# Patient Record
Sex: Female | Born: 1965 | Race: Black or African American | Hispanic: No | Smoking: Former smoker
Health system: Southern US, Community
[De-identification: ages and names within clinical notes are randomized; demographics above are authoritative.]

---

## 2021-05-13 ENCOUNTER — Inpatient Hospital Stay (HOSPITAL_COMMUNITY)
Admission: EM | Admit: 2021-05-13 | Discharge: 2021-05-18 | DRG: 871 | Disposition: A | Discharge status: Home / Self Care | Payer: Medicare HMO | Attending: Internal Medicine | Admitting: Internal Medicine

## 2021-05-13 ENCOUNTER — Emergency Department (HOSPITAL_COMMUNITY): Payer: Medicare HMO

## 2021-05-13 DIAGNOSIS — I248 Other forms of acute ischemic heart disease: Secondary | ICD-10-CM | POA: Diagnosis present

## 2021-05-13 DIAGNOSIS — J9601 Acute respiratory failure with hypoxia: Secondary | ICD-10-CM | POA: Diagnosis present

## 2021-05-13 DIAGNOSIS — E1122 Type 2 diabetes mellitus with diabetic chronic kidney disease: Secondary | ICD-10-CM | POA: Diagnosis present

## 2021-05-13 DIAGNOSIS — E1169 Type 2 diabetes mellitus with other specified complication: Secondary | ICD-10-CM | POA: Diagnosis present

## 2021-05-13 DIAGNOSIS — A419 Sepsis, unspecified organism: Principal | ICD-10-CM | POA: Diagnosis present

## 2021-05-13 DIAGNOSIS — R778 Other specified abnormalities of plasma proteins: Secondary | ICD-10-CM | POA: Diagnosis present

## 2021-05-13 DIAGNOSIS — Z20822 Contact with and (suspected) exposure to covid-19: Secondary | ICD-10-CM | POA: Diagnosis present

## 2021-05-13 DIAGNOSIS — Z86711 Personal history of pulmonary embolism: Secondary | ICD-10-CM

## 2021-05-13 DIAGNOSIS — Z794 Long term (current) use of insulin: Secondary | ICD-10-CM

## 2021-05-13 DIAGNOSIS — E7849 Other hyperlipidemia: Secondary | ICD-10-CM | POA: Diagnosis present

## 2021-05-13 DIAGNOSIS — Z7902 Long term (current) use of antithrombotics/antiplatelets: Secondary | ICD-10-CM

## 2021-05-13 DIAGNOSIS — I890 Lymphedema, not elsewhere classified: Secondary | ICD-10-CM

## 2021-05-13 DIAGNOSIS — N184 Chronic kidney disease, stage 4 (severe): Secondary | ICD-10-CM | POA: Diagnosis present

## 2021-05-13 DIAGNOSIS — Z950 Presence of cardiac pacemaker: Secondary | ICD-10-CM

## 2021-05-13 DIAGNOSIS — Z87891 Personal history of nicotine dependence: Secondary | ICD-10-CM

## 2021-05-13 DIAGNOSIS — I4891 Unspecified atrial fibrillation: Secondary | ICD-10-CM | POA: Diagnosis present

## 2021-05-13 DIAGNOSIS — Z79899 Other long term (current) drug therapy: Secondary | ICD-10-CM

## 2021-05-13 DIAGNOSIS — Z880 Allergy status to penicillin: Secondary | ICD-10-CM

## 2021-05-13 DIAGNOSIS — J440 Chronic obstructive pulmonary disease with acute lower respiratory infection: Secondary | ICD-10-CM | POA: Diagnosis present

## 2021-05-13 DIAGNOSIS — R6520 Severe sepsis without septic shock: Secondary | ICD-10-CM | POA: Diagnosis present

## 2021-05-13 DIAGNOSIS — I10 Essential (primary) hypertension: Secondary | ICD-10-CM | POA: Diagnosis present

## 2021-05-13 DIAGNOSIS — E871 Hypo-osmolality and hyponatremia: Secondary | ICD-10-CM | POA: Diagnosis present

## 2021-05-13 DIAGNOSIS — Z8673 Personal history of transient ischemic attack (TIA), and cerebral infarction without residual deficits: Secondary | ICD-10-CM

## 2021-05-13 DIAGNOSIS — I050 Rheumatic mitral stenosis: Secondary | ICD-10-CM | POA: Diagnosis present

## 2021-05-13 DIAGNOSIS — J441 Chronic obstructive pulmonary disease with (acute) exacerbation: Secondary | ICD-10-CM | POA: Diagnosis not present

## 2021-05-13 DIAGNOSIS — I130 Hypertensive heart and chronic kidney disease with heart failure and stage 1 through stage 4 chronic kidney disease, or unspecified chronic kidney disease: Secondary | ICD-10-CM | POA: Diagnosis present

## 2021-05-13 DIAGNOSIS — Z952 Presence of prosthetic heart valve: Secondary | ICD-10-CM

## 2021-05-13 DIAGNOSIS — J4540 Moderate persistent asthma, uncomplicated: Secondary | ICD-10-CM | POA: Diagnosis present

## 2021-05-13 DIAGNOSIS — E872 Acidosis, unspecified: Secondary | ICD-10-CM | POA: Diagnosis present

## 2021-05-13 DIAGNOSIS — G4733 Obstructive sleep apnea (adult) (pediatric): Secondary | ICD-10-CM | POA: Diagnosis present

## 2021-05-13 DIAGNOSIS — Z7901 Long term (current) use of anticoagulants: Secondary | ICD-10-CM

## 2021-05-13 DIAGNOSIS — I472 Ventricular tachycardia: Secondary | ICD-10-CM | POA: Diagnosis present

## 2021-05-13 DIAGNOSIS — Z888 Allergy status to other drugs, medicaments and biological substances status: Secondary | ICD-10-CM

## 2021-05-13 DIAGNOSIS — Z6841 Body Mass Index (BMI) 40.0 and over, adult: Secondary | ICD-10-CM

## 2021-05-13 DIAGNOSIS — I2510 Atherosclerotic heart disease of native coronary artery without angina pectoris: Secondary | ICD-10-CM | POA: Diagnosis present

## 2021-05-13 DIAGNOSIS — R2681 Unsteadiness on feet: Secondary | ICD-10-CM

## 2021-05-13 DIAGNOSIS — I5032 Chronic diastolic (congestive) heart failure: Secondary | ICD-10-CM | POA: Diagnosis present

## 2021-05-13 DIAGNOSIS — J189 Pneumonia, unspecified organism: Secondary | ICD-10-CM | POA: Diagnosis present

## 2021-05-13 DIAGNOSIS — E860 Dehydration: Secondary | ICD-10-CM | POA: Diagnosis present

## 2021-05-13 LAB — CBC WITH DIFFERENTIAL/PLATELET
Abs Immature Granulocytes: 0.09 10*3/uL — ABNORMAL HIGH (ref 0.00–0.07)
Basophils Absolute: 0.1 10*3/uL (ref 0.0–0.1)
Basophils Relative: 0 %
Eosinophils Absolute: 0.2 10*3/uL (ref 0.0–0.5)
Eosinophils Relative: 2 %
HCT: 41.4 % (ref 36.0–46.0)
Hemoglobin: 12.7 g/dL (ref 12.0–15.0)
Immature Granulocytes: 1 %
Lymphocytes Relative: 4 %
Lymphs Abs: 0.6 10*3/uL — ABNORMAL LOW (ref 0.7–4.0)
MCH: 25.8 pg — ABNORMAL LOW (ref 26.0–34.0)
MCHC: 30.7 g/dL (ref 30.0–36.0)
MCV: 84.1 fL (ref 80.0–100.0)
Monocytes Absolute: 0.6 10*3/uL (ref 0.1–1.0)
Monocytes Relative: 4 %
Neutro Abs: 14.2 10*3/uL — ABNORMAL HIGH (ref 1.7–7.7)
Neutrophils Relative %: 89 %
Platelets: 263 10*3/uL (ref 150–400)
RBC: 4.92 MIL/uL (ref 3.87–5.11)
RDW: 15.7 % — ABNORMAL HIGH (ref 11.5–15.5)
WBC: 15.8 10*3/uL — ABNORMAL HIGH (ref 4.0–10.5)
nRBC: 0 % (ref 0.0–0.2)

## 2021-05-13 LAB — COMPREHENSIVE METABOLIC PANEL
ALT: 19 U/L (ref 0–44)
AST: 18 U/L (ref 15–41)
Albumin: 3.2 g/dL — ABNORMAL LOW (ref 3.5–5.0)
Alkaline Phosphatase: 92 U/L (ref 38–126)
Anion gap: 13 (ref 5–15)
BUN: 62 mg/dL — ABNORMAL HIGH (ref 6–20)
CO2: 20 mmol/L — ABNORMAL LOW (ref 22–32)
Calcium: 9.1 mg/dL (ref 8.9–10.3)
Chloride: 101 mmol/L (ref 98–111)
Creatinine, Ser: 2.39 mg/dL — ABNORMAL HIGH (ref 0.44–1.00)
GFR, Estimated: 23 mL/min — ABNORMAL LOW (ref >60)
Glucose, Bld: 230 mg/dL — ABNORMAL HIGH (ref 70–99)
Potassium: 4.3 mmol/L (ref 3.5–5.1)
Sodium: 134 mmol/L — ABNORMAL LOW (ref 135–145)
Total Bilirubin: 0.9 mg/dL (ref 0.3–1.2)
Total Protein: 8.6 g/dL — ABNORMAL HIGH (ref 6.5–8.1)

## 2021-05-13 LAB — RESP PANEL BY RT-PCR (FLU A&B, COVID) ARPGX2
Influenza A by PCR: NEGATIVE
Influenza B by PCR: NEGATIVE
SARS Coronavirus 2 by RT PCR: NEGATIVE

## 2021-05-13 LAB — LIPASE, BLOOD: Lipase: 47 U/L (ref 11–51)

## 2021-05-13 LAB — LACTIC ACID, PLASMA: Lactic Acid, Venous: 2 mmol/L (ref 0.5–1.9)

## 2021-05-13 LAB — TROPONIN I (HIGH SENSITIVITY): Troponin I (High Sensitivity): 44 ng/L — ABNORMAL HIGH (ref <18)

## 2021-05-13 MED ORDER — METRONIDAZOLE 500 MG/100ML IV SOLN
500.0000 mg | Freq: Once | INTRAVENOUS | Status: AC
  Administered 2021-05-13: 500 mg via INTRAVENOUS
  Filled 2021-05-13: qty 100

## 2021-05-13 MED ORDER — LACTATED RINGERS IV SOLN: INTRAVENOUS | Status: DC

## 2021-05-13 MED ORDER — SODIUM CHLORIDE 0.9 % IV SOLN: 2.0000 g | INTRAVENOUS | Status: DC

## 2021-05-13 MED ORDER — VANCOMYCIN HCL IN DEXTROSE 1-5 GM/200ML-% IV SOLN
1000.0000 mg | Freq: Once | INTRAVENOUS | Status: AC
  Administered 2021-05-14: 1000 mg via INTRAVENOUS
  Filled 2021-05-13: qty 200

## 2021-05-13 MED ORDER — METHYLPREDNISOLONE SODIUM SUCC 125 MG IJ SOLR
125.0000 mg | Freq: Once | INTRAMUSCULAR | Status: AC
  Administered 2021-05-13: 125 mg via INTRAVENOUS
  Filled 2021-05-13: qty 2

## 2021-05-13 MED ORDER — ACETAMINOPHEN 325 MG PO TABS
650.0000 mg | ORAL_TABLET | Freq: Once | ORAL | Status: AC
  Administered 2021-05-14: 650 mg via ORAL
  Filled 2021-05-13: qty 2

## 2021-05-13 MED ORDER — SODIUM CHLORIDE 0.9 % IV SOLN
2.0000 g | Freq: Once | INTRAVENOUS | Status: AC
  Administered 2021-05-14: 2 g via INTRAVENOUS
  Filled 2021-05-13: qty 2

## 2021-05-13 MED ORDER — SODIUM CHLORIDE 0.9 % IV SOLN: Freq: Once | INTRAVENOUS | Status: AC

## 2021-05-13 MED ORDER — VANCOMYCIN VARIABLE DOSE PER UNSTABLE RENAL FUNCTION (PHARMACIST DOSING): Status: DC

## 2021-05-13 MED ORDER — MAGNESIUM SULFATE 2 GM/50ML IV SOLN
2.0000 g | Freq: Once | INTRAVENOUS | Status: AC
  Administered 2021-05-13: 2 g via INTRAVENOUS
  Filled 2021-05-13: qty 50

## 2021-05-13 NOTE — Sepsis Progress Note (Signed)
Monitoring for code sepsis protocol. 

## 2021-05-13 NOTE — ED Provider Notes (Signed)
[facility]ER[facility] DEPARTMENT Provider Note   CSN: [id number] Arrival date & time: 05/13/21  2020     History No chief complaint on file.   Carla Reynolds is a 55 y.o. female.  The history is provided by the patient, medical records and the EMS personnel. No language interpreter was used.  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  There are no problems to display for this patient.      OB History   No obstetric history on file.     No family history on file.     Home Medications Prior to Admission medications   Not on File    Allergies    Patient has no allergy information on record.  Review of Systems   Review of Systems  Constitutional:  Positive for chills, fatigue and fever.  HENT:  Positive for congestion.   Eyes:  Negative for visual disturbance.  Respiratory:  Positive for cough, sputum production, chest tightness, shortness of breath and wheezing.   Cardiovascular:  Positive for chest pain. Negative for palpitations and leg swelling.  Gastrointestinal:  Positive for diarrhea. Negative for abdominal pain, constipation, nausea and vomiting.  Genitourinary:  Negative for dysuria and flank pain.  Musculoskeletal:  Negative for back pain, neck pain and neck stiffness.  Skin:  Negative for rash and wound.  Neurological:  Negative for light-headedness and headaches.  Psychiatric/Behavioral:  Negative for agitation.   All other systems reviewed and are negative.  Physical  Exam Updated Vital Signs BP (!) 151/72 (BP Location: Right Wrist)   Pulse (!) 104   Temp 99.7 F (37.6 C) (Oral)   Resp (!) 24   SpO2 100%   Physical Exam Vitals and nursing note reviewed.  Constitutional:      General: She is not in acute distress.    Appearance: She is well-developed. She is ill-appearing. She is not toxic-appearing or diaphoretic.  HENT:     Head: Normocephalic and atraumatic.  Eyes:     Conjunctiva/sclera: Conjunctivae normal.     Pupils: Pupils are equal, round, and reactive to light.  Cardiovascular:     Rate and Rhythm: Regular rhythm. Tachycardia present.     Heart sounds: No murmur heard. Pulmonary:     Effort: Pulmonary effort is normal. Tachypnea present. No respiratory distress.     Breath sounds: Wheezing and rhonchi present. No rales.  Chest:     Chest wall: No tenderness.  Abdominal:     Palpations: Abdomen is soft.     Tenderness: There is no abdominal tenderness.  Musculoskeletal:     Cervical back: Neck supple.     Right lower leg: Edema present.     Left lower leg: Edema present.  Skin:    General: Skin is warm and dry.     Capillary Refill: Capillary refill takes less than 2 seconds.     Findings: No erythema.  Neurological:     General: No focal deficit present.     Mental Status:  She is alert.  Psychiatric:        Mood and Affect: Mood normal.    ED Results / Procedures / Treatments   Labs (all labs ordered are listed, but only abnormal results are displayed) Labs Reviewed  CBC WITH DIFFERENTIAL/PLATELET - Abnormal; Notable for the following components:      Result Value   WBC 15.8 (*)    MCH 25.8 (*)    RDW 15.7 (*)    Neutro Abs 14.2 (*)    Lymphs Abs 0.6 (*)    Abs Immature Granulocytes 0.09 (*)    All other components within normal limits  COMPREHENSIVE METABOLIC PANEL - Abnormal; Notable for the following components:   Sodium 134 (*)    CO2 20 (*)    Glucose, Bld 230 (*)    BUN 62 (*)    Creatinine, Ser 2.39  (*)    Total Protein 8.6 (*)    Albumin 3.2 (*)    GFR, Estimated 23 (*)    All other components within normal limits  LACTIC ACID, PLASMA - Abnormal; Notable for the following components:   Lactic Acid, Venous 2.0 (*)    All other components within normal limits  TROPONIN I (HIGH SENSITIVITY) - Abnormal; Notable for the following components:   Troponin I (High Sensitivity) 44 (*)    All other components within normal limits  RESP PANEL BY RT-PCR (FLU A&B, COVID) ARPGX2  CULTURE, BLOOD (ROUTINE X 2)  CULTURE, BLOOD (ROUTINE X 2)  LIPASE, BLOOD  LACTIC ACID, PLASMA  BRAIN NATRIURETIC PEPTIDE  TROPONIN I (HIGH SENSITIVITY)    EKG None  Radiology DG Chest Portable 1 View  Result Date: 05/13/2021 CLINICAL DATA:  Productive cough and chills, initial encounter EXAM: PORTABLE CHEST 1 VIEW COMPARISON:  None. FINDINGS: Cardiac shadow is within normal limits. Pacing device is seen in satisfactory position. Lungs are well aerated with mild right basilar atelectasis and small effusion. No other focal abnormality is seen. IMPRESSION: Mild right basilar atelectasis and effusion. Electronically Signed   By: Inez Catalina M.D.   On: 05/13/2021 21:51    Procedures Procedures   CRITICAL CARE Performed by: Gwenyth Allegra Mikah Poss Total critical care time: 40 minutes Critical care time was exclusive of separately billable procedures and treating other patients. Critical care was necessary to treat or prevent imminent or life-threatening deterioration. Critical care was time spent personally by me on the following activities: development of treatment plan with patient and/or surrogate as well as nursing, discussions with consultants, evaluation of patient's response to treatment, examination of patient, obtaining history from patient or surrogate, ordering and performing treatments and interventions, ordering and review of laboratory studies, ordering and review of radiographic studies, pulse oximetry  and re-evaluation of patient's condition.  Medications Ordered in ED Medications  ceFEPIme (MAXIPIME) 2 g in sodium chloride 0.9 % 100 mL IVPB (has no administration in time range)  vancomycin (VANCOCIN) IVPB 1000 mg/200 mL premix (has no administration in time range)  ceFEPIme (MAXIPIME) 2 g in sodium chloride 0.9 % 100 mL IVPB (has no administration in time range)  vancomycin variable dose per unstable renal function (pharmacist dosing) (has no administration in time range)  acetaminophen (TYLENOL) tablet 650 mg (has no administration in time range)  methylPREDNISolone sodium succinate (SOLU-MEDROL) 125 mg/2 mL injection 125 mg (125 mg Intravenous Given 05/13/21 2159)  magnesium sulfate IVPB 2 g 50 mL (0 g Intravenous Stopped 05/13/21 2331)  metroNIDAZOLE (FLAGYL) IVPB 500 mg (500 mg Intravenous New Bag/Given  05/13/21 2204)  0.9 %  sodium chloride infusion ( Intravenous New Bag/Given 05/13/21 2258)    ED Course  I have reviewed the triage vital signs and the nursing notes.  Pertinent labs & imaging results that were available during my care of the patient were reviewed by me and considered in my medical decision making (see chart for details).    MDM Rules/Calculators/A&P                           Carla Reynolds is a 55 y.o. 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  She also reports that for the last few days she has had a knot in her right thigh.  She says that she misses Eliquis frequently and is unsure if this feels like previous pulmonary embolism.  She says that she is fully vaccinated boosted against COVID-19 and has not had any sick exposures however starting yesterday started having this  constellation of URI symptoms.  She reports that today it worsened and when she went to get help she was found to have an oxygen saturation of 86% on room air.  She does not take oxygen at baseline.  She received several DuoNeb's with EMS with some improvement in her breathing but remains on a nonrebreather on arrival.  She is reporting no hemoptysis and denies any abdominal pain, nausea, vomiting.  She does report some diarrhea but denies constipation or urinary changes.  On exam, lungs have some coarseness as well as wheezing diffusely.  Chest was tender on the right chest.  Abdomen was nontender.  Good pulses in extremities.  Legs are edematous and her right thigh is tender to palpation.  She also has some tenderness in her right calf.  Patient is tachycardic and tachypneic and is on the nonrebreather.  She is warm to the touch and her second temperature returned at 103.1.  Clinically I am concerned with COVID-19 versus influenza infection given the ongoing pandemic however, pneumonia is also considered.  Given her new lump in her thigh that she feels with pain and her report of intermittent Eliquis use, DVT and pulmonary embolism is also strongly considered.  Given her ill appearance, we will likely make her a code sepsis but still check her for COVID and flu and make sure she does not have a pulmonary embolism.  Due to the hypoxia and her current illness, anticipate admission for further management.  Patient was made a code sepsis.  Patient's breathing began to slightly improve after magnesium, steroids, and the breathing treatments with EMS.  She was able to get off of oxygen but her temperature increased to 103.1.  She was given Tylenol.  X-ray shows some concern for pneumonia based on my evaluation of it however appears to show some atelectasis versus effusion on official read.  Clinically I am still concerned about a PE or DVT and the ultrasound was ordered.  CT scan is not able to be completed due  to her kidney function so she may need VQ scan during her admission however we will admit for presumptive pneumonia causing COPD exacerbation and hypoxia today.  Patient will be admitted for further management and received antibiotics for suspected pneumonia causing sepsis.  Final Clinical  Impression(s) / ED Diagnoses Final diagnoses:  Sepsis with acute renal failure without septic shock, due to unspecified organism, unspecified acute renal failure type ([place])  Community acquired pneumonia, unspecified laterality  COPD exacerbation ([place])    Clinical Impression: 1. Sepsis with acute renal failure without septic shock, due to unspecified organism, unspecified acute renal failure type ([place])   2. Community acquired pneumonia, unspecified laterality   3. COPD exacerbation ([place])     Disposition: Admit  This note was prepared with assistance of Dragon voice recognition software. Occasional wrong-word or sound-a-like substitutions may have occurred due to the inherent limitations of voice recognition software.     Watt Geiler, Gwenyth Allegra, MD 05/14/21 [phone]

## 2021-05-13 NOTE — Progress Notes (Signed)
Pharmacy Antibiotic Note  Carla Reynolds is a 55 y.o. female admitted on 05/13/2021 with sepsis.  Pharmacy has been consulted for Cefepime and vancomycin dosing.  WBC and SCr elevated. LA 2. nCrCl ~ 30 ml/min   Plan: -Start cefepime 2 gm IV 24 hours -Vancomycin 1 gm Ioad followed by vancomycin dosing per levels  -Monitor CBC, renal fx, cultures and clinical progress     Temp (24hrs), Avg:101.4 F (38.6 C), Min:99.7 F (37.6 C), Max:103.1 F (39.5 C)  No results for input(s): WBC, CREATININE, LATICACIDVEN, VANCOTROUGH, VANCOPEAK, VANCORANDOM, GENTTROUGH, GENTPEAK, GENTRANDOM, TOBRATROUGH, TOBRAPEAK, TOBRARND, AMIKACINPEAK, AMIKACINTROU, AMIKACIN in the last 168 hours.  CrCl cannot be calculated (No successful lab value found.).    Not on File  Antimicrobials this admission: Cefepime 8/16 >>  Vancomycin 8/16 >>   Dose adjustments this admission:   Microbiology results: 8/16 BCx:   Thank you for allowing pharmacy to be a part of this patient's care.  Albertina Parr, PharmD., BCPS, BCCCP Clinical Pharmacist Please refer to [facility] for unit-specific pharmacist

## 2021-05-13 NOTE — ED Triage Notes (Addendum)
Pt BIB [facility] from home for SOB, N/V/HA/Chills beginning around 1800 yest  Evening. Also complaining of a knot in her right thigh.  She takes eliquis but has missing some doses intermittently.  EMS reports pt was 86% on RA with fire.  EMS reports she has been 98% on NRB and/or aerosol mask for the 2 Duoneb's she received.   Pt has hx of COPD.  SHe does not wear 02 all the time but wears CPAP at night.   Last set of VS for EMS was 160/70, ventricular paced rhythm of 110 and CBG of 221. Pt did not take insulin today b/c of not feeling well.

## 2021-05-13 NOTE — ED Notes (Signed)
Pt is a hard stick I was unable to find a suitable vain for blood draw. Nurse was notified and states IV team has been called for this pt.

## 2021-05-14 ENCOUNTER — Observation Stay (HOSPITAL_COMMUNITY): Payer: Medicare HMO

## 2021-05-14 ENCOUNTER — Encounter (HOSPITAL_COMMUNITY): Payer: Self-pay | Admitting: Internal Medicine

## 2021-05-14 DIAGNOSIS — Z20822 Contact with and (suspected) exposure to covid-19: Secondary | ICD-10-CM | POA: Diagnosis present

## 2021-05-14 DIAGNOSIS — E860 Dehydration: Secondary | ICD-10-CM | POA: Diagnosis present

## 2021-05-14 DIAGNOSIS — J440 Chronic obstructive pulmonary disease with acute lower respiratory infection: Secondary | ICD-10-CM | POA: Diagnosis present

## 2021-05-14 DIAGNOSIS — R778 Other specified abnormalities of plasma proteins: Secondary | ICD-10-CM

## 2021-05-14 DIAGNOSIS — E1169 Type 2 diabetes mellitus with other specified complication: Secondary | ICD-10-CM | POA: Diagnosis present

## 2021-05-14 DIAGNOSIS — Z8679 Personal history of other diseases of the circulatory system: Secondary | ICD-10-CM

## 2021-05-14 DIAGNOSIS — I10 Essential (primary) hypertension: Secondary | ICD-10-CM

## 2021-05-14 DIAGNOSIS — J441 Chronic obstructive pulmonary disease with (acute) exacerbation: Secondary | ICD-10-CM | POA: Diagnosis present

## 2021-05-14 DIAGNOSIS — E7849 Other hyperlipidemia: Secondary | ICD-10-CM | POA: Diagnosis present

## 2021-05-14 DIAGNOSIS — G4733 Obstructive sleep apnea (adult) (pediatric): Secondary | ICD-10-CM | POA: Diagnosis present

## 2021-05-14 DIAGNOSIS — J189 Pneumonia, unspecified organism: Secondary | ICD-10-CM

## 2021-05-14 DIAGNOSIS — E872 Acidosis, unspecified: Secondary | ICD-10-CM | POA: Diagnosis present

## 2021-05-14 DIAGNOSIS — E1122 Type 2 diabetes mellitus with diabetic chronic kidney disease: Secondary | ICD-10-CM | POA: Diagnosis present

## 2021-05-14 DIAGNOSIS — I2510 Atherosclerotic heart disease of native coronary artery without angina pectoris: Secondary | ICD-10-CM | POA: Diagnosis present

## 2021-05-14 DIAGNOSIS — I050 Rheumatic mitral stenosis: Secondary | ICD-10-CM | POA: Diagnosis present

## 2021-05-14 DIAGNOSIS — E871 Hypo-osmolality and hyponatremia: Secondary | ICD-10-CM | POA: Diagnosis present

## 2021-05-14 DIAGNOSIS — Z952 Presence of prosthetic heart valve: Secondary | ICD-10-CM

## 2021-05-14 DIAGNOSIS — R6520 Severe sepsis without septic shock: Secondary | ICD-10-CM | POA: Diagnosis present

## 2021-05-14 DIAGNOSIS — I890 Lymphedema, not elsewhere classified: Secondary | ICD-10-CM

## 2021-05-14 DIAGNOSIS — Z794 Long term (current) use of insulin: Secondary | ICD-10-CM | POA: Diagnosis not present

## 2021-05-14 DIAGNOSIS — I472 Ventricular tachycardia: Secondary | ICD-10-CM | POA: Diagnosis present

## 2021-05-14 DIAGNOSIS — I130 Hypertensive heart and chronic kidney disease with heart failure and stage 1 through stage 4 chronic kidney disease, or unspecified chronic kidney disease: Secondary | ICD-10-CM | POA: Diagnosis present

## 2021-05-14 DIAGNOSIS — Z7901 Long term (current) use of anticoagulants: Secondary | ICD-10-CM

## 2021-05-14 DIAGNOSIS — N184 Chronic kidney disease, stage 4 (severe): Secondary | ICD-10-CM | POA: Diagnosis present

## 2021-05-14 DIAGNOSIS — Z6841 Body Mass Index (BMI) 40.0 and over, adult: Secondary | ICD-10-CM | POA: Diagnosis not present

## 2021-05-14 DIAGNOSIS — M79604 Pain in right leg: Secondary | ICD-10-CM | POA: Diagnosis not present

## 2021-05-14 DIAGNOSIS — J4540 Moderate persistent asthma, uncomplicated: Secondary | ICD-10-CM | POA: Diagnosis present

## 2021-05-14 DIAGNOSIS — A419 Sepsis, unspecified organism: Secondary | ICD-10-CM | POA: Diagnosis present

## 2021-05-14 DIAGNOSIS — I5032 Chronic diastolic (congestive) heart failure: Secondary | ICD-10-CM | POA: Diagnosis present

## 2021-05-14 DIAGNOSIS — I4819 Other persistent atrial fibrillation: Secondary | ICD-10-CM

## 2021-05-14 DIAGNOSIS — E782 Mixed hyperlipidemia: Secondary | ICD-10-CM

## 2021-05-14 DIAGNOSIS — J9601 Acute respiratory failure with hypoxia: Secondary | ICD-10-CM | POA: Diagnosis present

## 2021-05-14 DIAGNOSIS — I248 Other forms of acute ischemic heart disease: Secondary | ICD-10-CM | POA: Diagnosis present

## 2021-05-14 HISTORY — DX: Atherosclerotic heart disease of native coronary artery without angina pectoris: I25.10

## 2021-05-14 HISTORY — DX: Personal history of other diseases of the circulatory system: Z86.79

## 2021-05-14 HISTORY — DX: Type 2 diabetes mellitus with other specified complication: E11.69

## 2021-05-14 HISTORY — DX: Chronic kidney disease, stage 4 (severe): N18.4

## 2021-05-14 HISTORY — DX: Type 2 diabetes mellitus with diabetic chronic kidney disease: E11.22

## 2021-05-14 HISTORY — DX: Lymphedema, not elsewhere classified: I89.0

## 2021-05-14 HISTORY — DX: Essential (primary) hypertension: I10

## 2021-05-14 HISTORY — DX: Long term (current) use of insulin: Z79.4

## 2021-05-14 HISTORY — DX: Moderate persistent asthma, uncomplicated: J45.40

## 2021-05-14 HISTORY — DX: Presence of prosthetic heart valve: Z95.2

## 2021-05-14 LAB — GLUCOSE, CAPILLARY
Glucose-Capillary: 252 mg/dL — ABNORMAL HIGH (ref 70–99)
Glucose-Capillary: 296 mg/dL — ABNORMAL HIGH (ref 70–99)

## 2021-05-14 LAB — COMPREHENSIVE METABOLIC PANEL
ALT: 17 U/L (ref 0–44)
AST: 22 U/L (ref 15–41)
Albumin: 2.7 g/dL — ABNORMAL LOW (ref 3.5–5.0)
Alkaline Phosphatase: 62 U/L (ref 38–126)
Anion gap: 15 (ref 5–15)
BUN: 61 mg/dL — ABNORMAL HIGH (ref 6–20)
CO2: 16 mmol/L — ABNORMAL LOW (ref 22–32)
Calcium: 8.6 mg/dL — ABNORMAL LOW (ref 8.9–10.3)
Chloride: 102 mmol/L (ref 98–111)
Creatinine, Ser: 2.38 mg/dL — ABNORMAL HIGH (ref 0.44–1.00)
GFR, Estimated: 24 mL/min — ABNORMAL LOW (ref >60)
Glucose, Bld: 275 mg/dL — ABNORMAL HIGH (ref 70–99)
Potassium: 4.9 mmol/L (ref 3.5–5.1)
Sodium: 133 mmol/L — ABNORMAL LOW (ref 135–145)
Total Bilirubin: 1.5 mg/dL — ABNORMAL HIGH (ref 0.3–1.2)
Total Protein: 7.5 g/dL (ref 6.5–8.1)

## 2021-05-14 LAB — CBC WITH DIFFERENTIAL/PLATELET
Abs Immature Granulocytes: 0.07 10*3/uL (ref 0.00–0.07)
Basophils Absolute: 0.1 10*3/uL (ref 0.0–0.1)
Basophils Relative: 0 %
Eosinophils Absolute: 0.1 10*3/uL (ref 0.0–0.5)
Eosinophils Relative: 0 %
HCT: 38.2 % (ref 36.0–46.0)
Hemoglobin: 11.7 g/dL — ABNORMAL LOW (ref 12.0–15.0)
Immature Granulocytes: 0 %
Lymphocytes Relative: 2 %
Lymphs Abs: 0.3 10*3/uL — ABNORMAL LOW (ref 0.7–4.0)
MCH: 25.9 pg — ABNORMAL LOW (ref 26.0–34.0)
MCHC: 30.6 g/dL (ref 30.0–36.0)
MCV: 84.5 fL (ref 80.0–100.0)
Monocytes Absolute: 0.3 10*3/uL (ref 0.1–1.0)
Monocytes Relative: 2 %
Neutro Abs: 16.9 10*3/uL — ABNORMAL HIGH (ref 1.7–7.7)
Neutrophils Relative %: 96 %
Platelets: 195 10*3/uL (ref 150–400)
RBC: 4.52 MIL/uL (ref 3.87–5.11)
RDW: 15.8 % — ABNORMAL HIGH (ref 11.5–15.5)
WBC: 17.8 10*3/uL — ABNORMAL HIGH (ref 4.0–10.5)
nRBC: 0 % (ref 0.0–0.2)

## 2021-05-14 LAB — TROPONIN I (HIGH SENSITIVITY)
Troponin I (High Sensitivity): 148 ng/L (ref <18)
Troponin I (High Sensitivity): 218 ng/L (ref <18)
Troponin I (High Sensitivity): 184 ng/L (ref <18)
Troponin I (High Sensitivity): 173 ng/L (ref <18)

## 2021-05-14 LAB — CBG MONITORING, ED
Glucose-Capillary: 273 mg/dL — ABNORMAL HIGH (ref 70–99)
Glucose-Capillary: 232 mg/dL — ABNORMAL HIGH (ref 70–99)

## 2021-05-14 LAB — LACTIC ACID, PLASMA: Lactic Acid, Venous: 1.7 mmol/L (ref 0.5–1.9)

## 2021-05-14 LAB — HEMOGLOBIN A1C
Hgb A1c MFr Bld: 8.5 % — ABNORMAL HIGH (ref 4.8–5.6)
Mean Plasma Glucose: 197.25 mg/dL

## 2021-05-14 LAB — BRAIN NATRIURETIC PEPTIDE: B Natriuretic Peptide: 131.3 pg/mL — ABNORMAL HIGH (ref 0.0–100.0)

## 2021-05-14 LAB — HIV ANTIBODY (ROUTINE TESTING W REFLEX): HIV Screen 4th Generation wRfx: NONREACTIVE

## 2021-05-14 LAB — MAGNESIUM: Magnesium: 2 mg/dL (ref 1.7–2.4)

## 2021-05-14 MED ORDER — CEFTRIAXONE SODIUM 2 G IJ SOLR
2.0000 g | INTRAMUSCULAR | Status: AC
Start: 2021-05-14 — End: 2021-05-18
  Administered 2021-05-14 – 2021-05-18 (×5): 2 g via INTRAVENOUS
  Filled 2021-05-14 (×5): qty 20

## 2021-05-14 MED ORDER — LACTATED RINGERS IV SOLN: INTRAVENOUS | Status: DC

## 2021-05-14 MED ORDER — LOSARTAN POTASSIUM 25 MG PO TABS
25.0000 mg | ORAL_TABLET | Freq: Every day | ORAL | Status: DC
  Administered 2021-05-14 – 2021-05-16 (×3): 25 mg via ORAL
  Filled 2021-05-14 (×3): qty 1

## 2021-05-14 MED ORDER — FLUTICASONE PROPIONATE 50 MCG/ACT NA SUSP: 1.0000 | Freq: Every day | NASAL | Status: DC | PRN

## 2021-05-14 MED ORDER — ALBUTEROL SULFATE (2.5 MG/3ML) 0.083% IN NEBU
2.5000 mg | INHALATION_SOLUTION | RESPIRATORY_TRACT | Status: DC | PRN
  Administered 2021-05-15 – 2021-05-18 (×3): 2.5 mg via RESPIRATORY_TRACT
  Filled 2021-05-14 (×3): qty 3

## 2021-05-14 MED ORDER — INSULIN ASPART 100 UNIT/ML IJ SOLN
0.0000 [IU] | Freq: Three times a day (TID) | INTRAMUSCULAR | Status: DC
  Administered 2021-05-14: 7 [IU] via SUBCUTANEOUS
  Administered 2021-05-14 (×2): 11 [IU] via SUBCUTANEOUS
  Administered 2021-05-15 (×3): 4 [IU] via SUBCUTANEOUS
  Administered 2021-05-16: 3 [IU] via SUBCUTANEOUS
  Administered 2021-05-16: 4 [IU] via SUBCUTANEOUS
  Administered 2021-05-16: 7 [IU] via SUBCUTANEOUS
  Administered 2021-05-16 – 2021-05-17 (×2): 4 [IU] via SUBCUTANEOUS
  Administered 2021-05-17: 3 [IU] via SUBCUTANEOUS
  Administered 2021-05-17: 7 [IU] via SUBCUTANEOUS
  Administered 2021-05-17: 3 [IU] via SUBCUTANEOUS
  Administered 2021-05-18: 13 [IU] via SUBCUTANEOUS
  Administered 2021-05-18: 3 [IU] via SUBCUTANEOUS

## 2021-05-14 MED ORDER — LACTATED RINGERS IV BOLUS
1000.0000 mL | Freq: Once | INTRAVENOUS | Status: AC
  Administered 2021-05-14: 1000 mL via INTRAVENOUS

## 2021-05-14 MED ORDER — ONDANSETRON HCL 4 MG/2ML IJ SOLN: 4.0000 mg | Freq: Four times a day (QID) | INTRAMUSCULAR | Status: DC | PRN

## 2021-05-14 MED ORDER — GABAPENTIN 100 MG PO CAPS
100.0000 mg | ORAL_CAPSULE | Freq: Three times a day (TID) | ORAL | Status: DC
  Administered 2021-05-14 – 2021-05-18 (×13): 100 mg via ORAL
  Filled 2021-05-14 (×14): qty 1

## 2021-05-14 MED ORDER — ALBUTEROL SULFATE HFA 108 (90 BASE) MCG/ACT IN AERS: 2.0000 | INHALATION_SPRAY | Freq: Four times a day (QID) | RESPIRATORY_TRACT | Status: DC | PRN

## 2021-05-14 MED ORDER — APIXABAN 5 MG PO TABS
5.0000 mg | ORAL_TABLET | Freq: Two times a day (BID) | ORAL | Status: DC
  Administered 2021-05-14 – 2021-05-18 (×9): 5 mg via ORAL
  Filled 2021-05-14 (×9): qty 1

## 2021-05-14 MED ORDER — ACETAMINOPHEN 325 MG PO TABS
650.0000 mg | ORAL_TABLET | Freq: Four times a day (QID) | ORAL | Status: DC | PRN
  Administered 2021-05-14 – 2021-05-18 (×5): 650 mg via ORAL
  Filled 2021-05-14 (×5): qty 2

## 2021-05-14 MED ORDER — LOPERAMIDE HCL 2 MG PO CAPS
2.0000 mg | ORAL_CAPSULE | ORAL | Status: DC | PRN
  Administered 2021-05-14: 2 mg via ORAL
  Filled 2021-05-14: qty 1

## 2021-05-14 MED ORDER — ATORVASTATIN CALCIUM 80 MG PO TABS
80.0000 mg | ORAL_TABLET | Freq: Every day | ORAL | Status: DC
  Administered 2021-05-14 – 2021-05-18 (×5): 80 mg via ORAL
  Filled 2021-05-14 (×5): qty 1

## 2021-05-14 MED ORDER — ONDANSETRON HCL 4 MG PO TABS: 4.0000 mg | ORAL_TABLET | Freq: Four times a day (QID) | ORAL | Status: DC | PRN

## 2021-05-14 MED ORDER — LORATADINE 10 MG PO TABS
10.0000 mg | ORAL_TABLET | Freq: Every day | ORAL | Status: DC
  Administered 2021-05-14 – 2021-05-18 (×5): 10 mg via ORAL
  Filled 2021-05-14 (×5): qty 1

## 2021-05-14 MED ORDER — ACETAMINOPHEN 650 MG RE SUPP: 650.0000 mg | Freq: Four times a day (QID) | RECTAL | Status: DC | PRN

## 2021-05-14 MED ORDER — POLYETHYLENE GLYCOL 3350 17 G PO PACK
17.0000 g | PACK | Freq: Every day | ORAL | Status: DC | PRN
Start: 2021-05-14 — End: 2021-05-19

## 2021-05-14 MED ORDER — INSULIN ASPART 100 UNIT/ML IJ SOLN
10.0000 [IU] | Freq: Three times a day (TID) | INTRAMUSCULAR | Status: DC
  Administered 2021-05-14 – 2021-05-18 (×12): 10 [IU] via SUBCUTANEOUS

## 2021-05-14 MED ORDER — AMLODIPINE BESYLATE 5 MG PO TABS
5.0000 mg | ORAL_TABLET | Freq: Every day | ORAL | Status: DC
  Administered 2021-05-14 – 2021-05-18 (×5): 5 mg via ORAL
  Filled 2021-05-14 (×5): qty 1

## 2021-05-14 MED ORDER — SODIUM CHLORIDE 0.9 % IV SOLN
100.0000 mg | Freq: Two times a day (BID) | INTRAVENOUS | Status: DC
  Administered 2021-05-14 – 2021-05-18 (×8): 100 mg via INTRAVENOUS
  Filled 2021-05-14 (×10): qty 100

## 2021-05-14 MED ORDER — SODIUM CHLORIDE 0.9 % IV SOLN
500.0000 mg | Freq: Every day | INTRAVENOUS | Status: DC
  Administered 2021-05-14: 500 mg via INTRAVENOUS
  Filled 2021-05-14 (×2): qty 500

## 2021-05-14 MED ORDER — PREDNISOLONE ACETATE 1 % OP SUSP
1.0000 [drp] | Freq: Three times a day (TID) | OPHTHALMIC | Status: DC
  Administered 2021-05-14 – 2021-05-18 (×17): 1 [drp] via OPHTHALMIC
  Filled 2021-05-14: qty 5

## 2021-05-14 MED ORDER — FLUTICASONE FUROATE-VILANTEROL 200-25 MCG/INH IN AEPB
1.0000 | INHALATION_SPRAY | Freq: Every day | RESPIRATORY_TRACT | Status: DC
  Administered 2021-05-16 – 2021-05-18 (×3): 1 via RESPIRATORY_TRACT
  Filled 2021-05-14 (×2): qty 28

## 2021-05-14 MED ORDER — CARVEDILOL 12.5 MG PO TABS
12.5000 mg | ORAL_TABLET | Freq: Two times a day (BID) | ORAL | Status: DC
  Administered 2021-05-14 – 2021-05-18 (×10): 12.5 mg via ORAL
  Filled 2021-05-14 (×3): qty 1
  Filled 2021-05-14: qty 4
  Filled 2021-05-14 (×6): qty 1

## 2021-05-14 MED ORDER — INSULIN DETEMIR 100 UNIT/ML ~~LOC~~ SOLN
22.0000 [IU] | Freq: Two times a day (BID) | SUBCUTANEOUS | Status: DC
  Administered 2021-05-14 – 2021-05-18 (×9): 22 [IU] via SUBCUTANEOUS
  Filled 2021-05-14 (×10): qty 0.22

## 2021-05-14 NOTE — Progress Notes (Signed)
   05/14/21 1955  Assess: MEWS Score  Temp (!) 101.7 F (38.7 C)  BP 119/71  Pulse Rate 78  Resp 14  SpO2 91 %  O2 Device Room Air  Assess: MEWS Score  MEWS Temp 2  MEWS Systolic 0  MEWS Pulse 0  MEWS RR 0  MEWS LOC 0  MEWS Score 2  MEWS Score Color Yellow  Assess: if the MEWS score is Yellow or Red  Were vital signs taken at a resting state? Yes  Focused Assessment No change from prior assessment  Early Detection of Sepsis Score *See Row Information* High  MEWS guidelines implemented *See Row Information* Yes  Treat  MEWS Interventions Administered prn meds/treatments  Pain Scale 0-10  Pain Score 0  Take Vital Signs  Increase Vital Sign Frequency  Yellow: Q 2hr X 2 then Q 4hr X 2, if remains yellow, continue Q 4hrs  Escalate  MEWS: Escalate Yellow: discuss with charge nurse/RN and consider discussing with provider and RRT  Notify: Charge Nurse/RN  Name of Charge Nurse/RN Notified Lyn, RN  Date Charge Nurse/RN Notified 05/14/21  Time Charge Nurse/RN Notified 2000  Notify: Provider  Provider Name/Title Sidney Ace, MD  Date Provider Notified 05/14/21  Time Provider Notified 2000  Notification Type Page  Notification Reason Change in status  Provider response Other (Comment) (give tylneol early)  Date of Provider Response 05/14/21  Document  Patient Outcome Stabilized after interventions  Progress note created (see row info) Yes   Pt with elevated temperature of 101.36F. PRN Tylenol not due at this time.  MD notified and stated to give the prn tylenol early at this time.  Repeat temp 99.36F.  Yellow MEWS protocol initiated.

## 2021-05-14 NOTE — ED Notes (Signed)
Pt called out and said she had a BM. Pt had diarrhea. Peri-care completed. Total bed changed. Purewick removed and trashed. Gown changed. Pt denies further needs.

## 2021-05-14 NOTE — ED Notes (Signed)
I introduced myself to pt. Pt very sleepy. Oriented x4. Denies pain. Pt hooked up to purewick, but with minimal output in the bucket.

## 2021-05-14 NOTE — ED Notes (Signed)
Pt placed on bedpan as requested and had a bowel movement and urinated which spilled on the bed. PT was then cleaned and sheets changed with the help of another RN

## 2021-05-14 NOTE — ED Notes (Signed)
Blood draw unsuccessful 

## 2021-05-14 NOTE — ED Notes (Signed)
MD Shalhoub notified of critical trop result

## 2021-05-14 NOTE — Plan of Care (Signed)
Admitted from ED via stretcher.  Alert x oriented x 4. Denies any pain at the moment. No SOB noted. Will continue to monitor.

## 2021-05-14 NOTE — Progress Notes (Addendum)
Patient with 11 beats of VT.  Verbal order for STAT mag and potassium lab.  Orders placed.   Potassium 4.3 and magnesium 2.2. No further orders at this time.

## 2021-05-14 NOTE — H&P (Signed)
History and Physical    Carla Reynolds [id number] DOB: 09/07/1966 DOA: 05/13/2021  PCP: Mosu, Darlin Coco, NP  Patient coming from: Home via EMS   Chief Complaint:  Chief Complaint  Patient presents with   Shortness of Breath     HPI:    55 year old female with past medical history of chronic kidney disease stage IV, pulmonary embolism, obstructive sleep apnea, hypertension, hyperlipidemia, insulin-dependent diabetes mellitus type 2, moderate persistent asthma, coronary artery disease (Cath in 2020 with moderate multivessel disease), severe aortic stenosis (S/P TAVR 11/2018), pacemaker placement (0000000), diastolic congestive heart failure (Echo 01/2021 with G2DD) who presents to [facility] emergency department via EMS with complaints of shortness of breath.  Patient explains that for approximate the past 2 days she has been experiencing progressively worsening shortness of breath.  Shortness of breath is severe in intensity, worse with exertion and improved with rest.  Shortness of breath associated with cough, intermittently productive with bouts of fever and chills.  Patient complains of associated generalized weakness, nausea and occasional vomiting over the same span of time.  As a result patient has had an extremely poor appetite.  Upon further questioning patient denies sick contacts, recent travel or contact with confirmed COVID-19 infection.  Due to patient's progressively worsening symptoms she eventually contacted EMS who promptly came to evaluate the patient.  Per EMS, patient was found to be saturating in the 80s upon initial assessment was placed on submental oxygen.  Patient was then brought into [facility] emergency department for evaluation.  Upon evaluation in the emergency department patient was found to exhibit multiple SIRS criteria including leukocytosis of 15.8, temperature of 103.1 F and a mild lactic acidosis of 2.0.  Patient was additionally  found to have slightly elevated troponin of 44.  Patient was initiated on intravenous antibiotic therapy initially with intravenous cefepime and vancomycin.  The hospitalist group was then called to assess the patient for admission to the hospital  Review of Systems:   Review of Systems  Constitutional:  Positive for chills, fever and malaise/fatigue.  Respiratory:  Positive for cough and shortness of breath.   Neurological:  Positive for weakness.  All other systems reviewed and are negative.  Past Medical History:  Diagnosis Date   CKD (chronic kidney disease), stage IV ([place]) 05/14/2021   Coronary artery disease involving native coronary artery of native heart without angina pectoris 05/14/2021   Essential hypertension 05/14/2021   History of aortic stenosis 05/14/2021   Lymphedema 05/14/2021   Mixed diabetic hyperlipidemia associated with type 2 diabetes mellitus ([place]) 05/14/2021   Moderate persistent asthma without complication [id number]   S/P TAVR (transcatheter aortic valve replacement) 05/14/2021   Type 2 diabetes mellitus with stage 4 chronic kidney disease, with long-term current use of insulin ([place]) 05/14/2021    History reviewed. No pertinent surgical history.   reports that she has quit smoking. Her smoking use included cigarettes. She has never used smokeless tobacco. She reports that she does not drink alcohol and does not use drugs.  Allergies  Allergen Reactions   Penicillins Hives, Itching and Rash   Ace Inhibitors Cough   Cefadroxil Rash    Family History  Problem Relation Age of Onset   Heart disease Neg Hx      Prior to Admission medications   Medication Sig Start Date End Date Taking? Authorizing Provider  acetaminophen (TYLENOL) 500 MG tablet Take 500 mg by mouth daily as needed for pain.   Yes [provider]  ADVAIR DISKUS 250-50 MCG/ACT AEPB Inhale 1 puff into the lungs 2 (two) times daily. 04/28/21  Yes [provider]  albuterol  (PROVENTIL) (2.5 MG/3ML) 0.083% nebulizer solution Take 2.5 mg by nebulization every 4 (four) hours as needed for shortness of breath or wheezing. 04/28/21  Yes [provider]  albuterol (VENTOLIN HFA) 108 (90 Base) MCG/ACT inhaler Inhale 2 puffs into the lungs every 6 (six) hours as needed for shortness of breath. 04/28/21  Yes [provider]  amLODipine (NORVASC) 5 MG tablet Take 5 mg by mouth daily. 04/28/21  Yes [provider]  ammonium lactate (AMLACTIN) 12 % cream Apply 1 application topically 2 (two) times daily. 04/28/21  Yes [provider]  atorvastatin (LIPITOR) 80 MG tablet Take 80 mg by mouth daily. 04/28/21  Yes [provider]  bumetanide (BUMEX) 2 MG tablet Take 2 mg by mouth 2 (two) times daily. 04/28/21  Yes [provider]  carvedilol (COREG) 12.5 MG tablet Take 12.5 mg by mouth 2 (two) times daily. 04/28/21  Yes [provider]  cetirizine (ZYRTEC) 10 MG tablet Take 10 mg by mouth daily. 04/01/21  Yes [provider]  Cholecalciferol 50 MCG (2000 UT) CAPS Take 1 capsule by mouth daily. 02/27/20  Yes [provider]  ELIQUIS 5 MG TABS tablet Take 5 mg by mouth 2 (two) times daily. 04/28/21  Yes [provider]  ferrous sulfate 325 (65 FE) MG tablet Take 1 tablet by mouth daily with breakfast. 02/27/20  Yes [provider]  fluticasone (FLONASE) 50 MCG/ACT nasal spray Place 1 spray into both nostrils daily as needed for allergies. 04/28/21  Yes [provider]  gabapentin (NEURONTIN) 100 MG capsule Take 100 mg by mouth 3 (three) times daily. 04/28/21  Yes [provider]  JARDIANCE 10 MG TABS tablet Take 10 mg by mouth daily. 04/28/21  Yes [provider]  LEVEMIR FLEXTOUCH 100 UNIT/ML FlexTouch Pen Inject 22 Units into the skin 2 (two) times daily. 04/05/21  Yes [provider]  losartan (COZAAR) 25 MG tablet Take 25 mg by mouth daily. 04/28/21  Yes [provider]   Multiple Vitamin (MULTI-VITAMIN) tablet Take 1 tablet by mouth daily. 02/27/20  Yes [provider]  NOVOLOG FLEXPEN 100 UNIT/ML FlexPen Inject 10-20 Units into the skin 3 (three) times daily. 04/01/21  Yes [provider]  prednisoLONE acetate (PRED FORTE) 1 % ophthalmic suspension Place 1 drop into the left eye 4 (four) times daily.   Yes [provider]  VICTOZA 18 MG/3ML SOPN Inject 1.8 mg into the skin daily. 04/28/21  Yes [provider]  clopidogrel (PLAVIX) 75 MG tablet Take 75 mg by mouth daily. Patient not taking: No sig reported 04/28/21   [provider]    Physical Exam: Vitals:   05/13/21 2025 05/13/21 2100 05/13/21 2206 05/14/21 0141  BP: (!) 151/72 105/65 (!) 126/104 (!) 85/51  Pulse: (!) 104 95 93 99  Resp: (!) 24 (!) 22 10 (!) 21  Temp: 99.7 F (37.6 C) (!) 103.1 F (39.5 C)  98.2 F (36.8 C)  TempSrc: Oral Rectal  Oral  SpO2: 100% 100% 96% 96%    Constitutional: Lethargic arousable and oriented x3, no associated distress.   Skin: Extremely dry scaly skin with evidence of hyperkeratinization particularly of the bilateral lower extremities.  Poor skin turgor in general.   Eyes: Pupils are equally reactive to light.  No evidence of scleral icterus or conjunctival pallor.  ENMT: Dry  mucous membranes noted.  Posterior pharynx clear of any exudate or lesions.   Neck: normal, supple, no masses, no thyromegaly.  No evidence of jugular venous distension.   Respiratory: Somewhat coarse breath sounds with bibasilar rales, worst in the left lower fields.  No increased respiratory effort. No accessory muscle use.  Cardiovascular: Cardiac rate with regular rhythm, no murmurs / rubs / gallops. No extremity edema. 2+ pedal pulses. No carotid bruits.  Chest:   Nontender without crepitus or deformity.   Back:   Nontender without crepitus or deformity. Abdomen: Abdomen is protuberant but soft and nontender.  No evidence of intra-abdominal masses.   Positive bowel sounds noted in all quadrants.   Musculoskeletal: Notable tenderness at the distal right lower extremity.  Notable abrasion over the heel of the right foot.  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   no contractures. Normal muscle tone.  Neurologic: Patient is lethargic but arousable and oriented x3.  CN 2-12 grossly intact. Sensation intact.  Patient moving all 4 extremities spontaneously.  Patient is following all commands.  Patient is responsive to verbal stimuli.   Psychiatric: Patient exhibits depressed mood with appropriate affect.  Patient seems to possess insight as to their current situation.     Labs on Admission: I have personally reviewed following labs and imaging studies -   CBC: Recent Labs  Lab 05/13/21 2135  WBC 15.8*  NEUTROABS 14.2*  HGB 12.7  HCT 41.4  MCV 84.1  PLT 99991111   Basic Metabolic Panel: Recent Labs  Lab 05/13/21 2135  NA 134*  K 4.3  CL 101  CO2 20*  GLUCOSE 230*  BUN 62*  CREATININE 2.39*  CALCIUM 9.1   GFR: CrCl cannot be calculated (Unknown ideal weight.). Liver Function Tests: Recent Labs  Lab 05/13/21 2135  AST 18  ALT 19  ALKPHOS 92  BILITOT 0.9  PROT 8.6*  ALBUMIN 3.2*   Recent Labs  Lab 05/13/21 2135  LIPASE 47   No results for input(s): AMMONIA in the last 168 hours. Coagulation Profile: No results for input(s): INR, PROTIME in the last 168 hours. Cardiac Enzymes: No results for input(s): CKTOTAL, CKMB, CKMBINDEX, TROPONINI in the last 168 hours. BNP (last 3 results) No results for input(s): PROBNP in the last 8760 hours. HbA1C: No results for input(s): HGBA1C in the last 72 hours. CBG: No results for input(s): GLUCAP in the last 168 hours. Lipid Profile: No results for input(s): CHOL, HDL, LDLCALC, TRIG, CHOLHDL, LDLDIRECT in the last 72 hours. Thyroid Function Tests: No results for input(s): TSH, T4TOTAL, FREET4, T3FREE, THYROIDAB in the last 72 hours. Anemia Panel: No results for  input(s): VITAMINB12, FOLATE, FERRITIN, TIBC, IRON, RETICCTPCT in the last 72 hours. Urine analysis: No results found for: COLORURINE, APPEARANCEUR, LABSPEC, [place], GLUCOSEU, HGBUR, BILIRUBINUR, KETONESUR, PROTEINUR, UROBILINOGEN, NITRITE, LEUKOCYTESUR  Radiological Exams on Admission - Personally Reviewed: DG Chest Portable 1 View  Result Date: 05/13/2021 CLINICAL DATA:  Productive cough and chills, initial encounter EXAM: PORTABLE CHEST 1 VIEW COMPARISON:  None. FINDINGS: Cardiac shadow is within normal limits. Pacing device is seen in satisfactory position. Lungs are well aerated with mild right basilar atelectasis and small effusion. No other focal abnormality is seen. IMPRESSION: Mild right basilar atelectasis and effusion. Electronically Signed   By: Inez Catalina M.D.   On: 05/13/2021 21:51    EKG: Personally reviewed.  Do not agree with computer.  Rhythm is paced wide-complex rhythm at 93 bpm, not atrial fibrillation.  Assessment/Plan Principal Problem:  Pneumonia of left lung due to infectious organism complicated by SIRS  Patient presenting with several days of progressively worsening shortness of breath, cough generalized weakness and malaise. Patient presenting with multiple SIRS criteria including leukocytosis, fever, tachycardia and tachypnea without significant organ dysfunction Chest x-ray reveals notable infiltrates throughout the left lung fields.  Disagree with radiology and feel that this is likely developing pneumonia. Patient is being treated with intravenous antibiotics with ceftriaxone and azithromycin Intravenous hydration with isotonic fluids Blood cultures obtained Will provide patient with additional supplemental oxygen as necessary for bouts of hypoxia  Active Problems:   Lactic acidosis  Very mild lactic acidosis of 2.0  Downtrending and resolution achieved with intravenous antibiotics and fluids.    CKD (chronic kidney disease), stage IV ([facility])  Strict  intake and output monitoring Creatinine near baseline Minimizing nephrotoxic agents as much as possible Serial chemistries to monitor renal function and electrolytes    Type 2 diabetes mellitus with stage 4 chronic kidney disease, with long-term current use of insulin ([place])  Patient been placed on Accu-Cheks before every meal and nightly with sliding scale insulin Holding home regimen of oral hypoglycemics Continuing home regimen of insulin therapy Hemoglobin A1C ordered Diabetic Diet    Mixed diabetic hyperlipidemia associated with type 2 diabetes mellitus ([place])  Continuing home regimen of lipid lowering therapy.    Moderate persistent asthma without complication  While patient has known history of moderate persistent asthma, lung exam is not consistent with asthma exacerbation. Continue home regimen of maintenance inhalers Additional as needed bronchodilator therapy for bouts of shortness of breath and wheezing.    Lymphedema  outpatient follow-up.  Elevated troponin   Initial troponin 44 with second opinion rising to 148  Patient currently chest pain-free  This is likely secondary to supply demand mismatch in the setting of severe infection  Will continue to trend troponin and if it continues to rise we will obtain cardiology consultation  Monitoring patient on telemetry  Patient already anticoagulated with Eliquis  Coronary artery disease involving native coronary artery of native heart without angina pectoris  Patient has known moderate multivessel disease as identified by cardiac catheterization in 2020 Currently chest pain-free As mentioned above, trending cardiac enzymes as troponin is slightly elevated. Monitoring on telemetry    Essential hypertension  Continue home regimen of Norvasc and Cozaar Monitoring for episodic hypotension    Code Status:  Full code Family Communication: deferred   Status is: Observation  The patient remains OBS appropriate and  will d/c before 2 midnights.  Dispo: The patient is from: Home              Anticipated d/c is to: Home              Patient currently is not medically stable to d/c.   Difficult to place patient No        Vernelle Emerald MD [facility] Pager [phone]  If 7PM-7AM, please contact night-coverage [email or web address] Use universal [place] password for that web site. If you do not have the password, please call the hospital operator.  05/14/2021, 3:07 AM

## 2021-05-14 NOTE — ED Notes (Signed)
Admitting physician at bedside

## 2021-05-14 NOTE — ED Notes (Signed)
I attempted to obtain repeat troponin from both IVs and from phlebotomy stick without success.

## 2021-05-14 NOTE — ED Notes (Signed)
A couple of broken skin places are noted on pt's right heel and rt inner leg near knee. The one near knee is prob nickel size and looks like a skin tear.

## 2021-05-14 NOTE — Progress Notes (Signed)
Interval events noted.  She feels better today.  She said she had chills, chest pain and vomiting yesterday.  She still feels a little short of breath although it has improved.  She has no other symptoms today.  She says she ambulates with a gait at baseline.  Continue current treatment including empiric IV antibiotics for pneumonia.  Consulted cardiologist for elevated troponins [phone]).

## 2021-05-14 NOTE — Consult Note (Addendum)
Cardiology Consultation:   Patient ID: Carla Reynolds MRN: [id number]; DOB: 25-Aug-1966  Admit date: 05/13/2021 Date of Consult: 05/14/2021  PCP:  Mosu, Darlin Coco, NP   [place] Providers Cardiologist: Adella Nissen, MD  @ [facility]  Patient Profile:   Carla Reynolds is a 55 y.o. female with a hx of aortic stenosis s/p TVAR 11/2018, moderate non obstructive CAD, Chronic diastolic CHF, atrial fibrillation on Eliquis, diabetes mellitus, OSA on CPAP, hypertension, obesity, CVA, CHB s/p PPM, moderate persistent asthma, and CKD stage IV who is being seen 05/14/2021 for the evaluation of Elevated troponin at the request of Dr. Cyd Silence.   She was found to have aortic stenosis during evaluation of shortness of breath.  Cardiac cath showed moderate CAD with 70% RPDA, 50% mid LAD and 70% distal OM 3 disease.  She subsequently underwent to our with 26 mm S3 valve.  It was complicated by bradycardia and underwent Medtronic dual chamber pacemaker implantation.  Last seen by Dr. Marylen Ponto 01/2021.   History of Present Illness:   Carla Reynolds lives in [place], [place].  She is visiting her son for past 2 weeks.  Yesterday she did not felt well and then started to have an acute onset shortness of breath.  This was progressively got worsened with nausea, vomiting and chills.  EMS was called and she was found hypoxic at 86% on room air.  Oxygenation improved to 98% on nonrebreather.   BNP 131 Hs-troponin 44>>148>>218 Scr 2.39>>2.38 Lactic acid 2.0>>1.7 HgbA1c 8.5  Admitted for SIRS secondary to left lung pneumonia and started on broad-spectrum antibiotic.  Blood culture has been drawn.  Cardiology is asked for evaluation of elevated troponin.  Patient denies chest pain, orthopnea, PND, syncope, dizziness or melena.  Has chronic lower extremity edema.  Reports compliance with medication.    Past Medical History:  Diagnosis Date   CKD (chronic kidney disease), stage IV ([place]) 05/14/2021    Coronary artery disease involving native coronary artery of native heart without angina pectoris 05/14/2021   Essential hypertension 05/14/2021   History of aortic stenosis 05/14/2021   Lymphedema 05/14/2021   Mixed diabetic hyperlipidemia associated with type 2 diabetes mellitus ([place]) 05/14/2021   Moderate persistent asthma without complication [id number]   S/P TAVR (transcatheter aortic valve replacement) 05/14/2021   Type 2 diabetes mellitus with stage 4 chronic kidney disease, with long-term current use of insulin ([place]) 05/14/2021    Inpatient Medications: Scheduled Meds:  amLODipine  5 mg Oral Daily   apixaban  5 mg Oral BID   atorvastatin  80 mg Oral Daily   carvedilol  12.5 mg Oral BID WC   fluticasone furoate-vilanterol  1 puff Inhalation Daily   gabapentin  100 mg Oral TID   insulin aspart  0-20 Units Subcutaneous TID AC & HS   insulin aspart  10 Units Subcutaneous TID WC   insulin detemir  22 Units Subcutaneous BID   loratadine  10 mg Oral Daily   losartan  25 mg Oral Daily   prednisoLONE acetate  1 drop Left Eye TID AC & HS   Continuous Infusions:  cefTRIAXone (ROCEPHIN)  IV Stopped (05/14/21 1008)   doxycycline (VIBRAMYCIN) IV     PRN Meds: acetaminophen **OR** acetaminophen, albuterol, fluticasone, ondansetron **OR** ondansetron (ZOFRAN) IV, polyethylene glycol  Allergies:    Allergies  Allergen Reactions   Penicillins Hives, Itching and Rash   Ace Inhibitors Cough   Cefadroxil Rash    Social History:   Social History   Socioeconomic  History   Marital status: Divorced    Spouse name: Not on file   Number of children: Not on file   Years of education: Not on file   Highest education level: Not on file  Occupational History   Not on file  Tobacco Use   Smoking status: Former    Types: Cigarettes   Smokeless tobacco: Never  Substance and Sexual Activity   Alcohol use: Never   Drug use: Never   Sexual activity: Not on file  Other Topics Concern   Not on  file  Social History Narrative   Not on file   Social Determinants of Health   Financial Resource Strain: Not on file  Food Insecurity: Not on file  Transportation Needs: Not on file  Physical Activity: Not on file  Stress: Not on file  Social Connections: Not on file  Intimate Partner Violence: Not on file    Family History:    Family History  Problem Relation Age of Onset   Heart disease Neg Hx      ROS:  Please see the history of present illness.  All other ROS reviewed and negative.     Physical Exam/Data:   Vitals:   05/14/21 0800 05/14/21 0830 05/14/21 0900 05/14/21 1250  BP: (!) 153/79 123/66 122/66 120/60  Pulse: 93 82 77 80  Resp: (!) 21 (!) 21 20 (!) 22  Temp:    99.1 F (37.3 C)  TempSrc:    Oral  SpO2: 94% 95% 99% 96%   No intake or output data in the 24 hours ending 05/14/21 1644 No flowsheet data found.   There is no height or weight on file to calculate BMI.  General:  obese female in no acute distress HEENT: normal Lymph: no adenopathy Neck: JVD difficult to evaluate Endocrine:  No thryomegaly Vascular: No carotid bruits; FA pulses 2+ bilaterally without bruits  Cardiac:  normal S1, S2; RRR; no murmur  Lungs:  diminished breath sound  Abd: soft, nontender, no hepatomegaly  Ext/Musculoskeletal:  R toe amputations, skin ulcer  Skin: warm and dry  Neuro:  CNs 2-12 intact, no focal abnormalities noted Psych:  Normal affect   EKG:  The EKG was personally reviewed and demonstrates:  V paced rhythm, IVCD Telemetry:  Telemetry was personally reviewed and demonstrates:  V pace rhythm   Relevant CV Studies:  Echo 10/2020 Summary    1. Technically difficult study due to chest wall/lung interference and body  habitus.    2. Echo contrast utilized to enhance endocardial border definition.    3. The left ventricle is normal in size with mildly increased wall  thickness.    4. The left ventricular systolic function is overall normal, LVEF is   visually estimated at 50-55%.    5. There is grade II diastolic dysfunction (elevated filling pressure).    6. The left atrium is mildly dilated in size.    7. The mitral valve leaflets are mildly thickened with mildly reduced  leaflet mobility.    8. Mitral annular calcification is present.    9. Aortic valve replacement (26 mm Edwards SAPIEN TAVR, implantation date:  12/06/2018).    10. Aortic valve Doppler indices are consistent with normal prosthetic valve  function.    11. The right ventricle is normal in size, with normal systolic function.     LHC 12/08/18 1. Implantation of catheter-delivered prosthetic aortic heart valve (26 mm Sapien S3) percutaneous femoral artery approach.  2. Percutaneous right and left  femoral arterial access (CPT W699183) under fluoroscopic guidance (CPT 77001).  3. Introduction of catheter aorta, bilateral (CPT 36200-50).  4. Percutaneous transvenous pacemaker insertion via left common femoral vein  70% rPDA (2.48m vessel) Diffuse 50-60% midLAD 70% distal OM3   RA 149mg RV 70/17 PA 70/45 (mean 55) PCWP 4075m  AV Gradient (mean) 48m97m PVR 3.2 WU CI 2.0 L/min/m2   Laboratory Data:  High Sensitivity Troponin:   Recent Labs  Lab 05/13/21 2135 05/14/21 0030 05/14/21 1008  TROPONINIHS 44* 148* 218*     Chemistry Recent Labs  Lab 05/13/21 2135 05/14/21 1008  NA 134* 133*  K 4.3 4.9  CL 101 102  CO2 20* 16*  GLUCOSE 230* 275*  BUN 62* 61*  CREATININE 2.39* 2.38*  CALCIUM 9.1 8.6*  GFRNONAA 23* 24*  ANIONGAP 13 15    Recent Labs  Lab 05/13/21 2135 05/14/21 1008  PROT 8.6* 7.5  ALBUMIN 3.2* 2.7*  AST 18 22  ALT 19 17  ALKPHOS 92 62  BILITOT 0.9 1.5*   Hematology Recent Labs  Lab 05/13/21 2135 05/14/21 1008  WBC 15.8* 17.8*  RBC 4.92 4.52  HGB 12.7 11.7*  HCT 41.4 38.2  MCV 84.1 84.5  MCH 25.8* 25.9*  MCHC 30.7 30.6  RDW 15.7* 15.8*  PLT 263 195   BNP Recent Labs  Lab 05/13/21 2135  BNP 131.3*     DDimer No results for input(s): DDIMER in the last 168 hours.   Radiology/Studies:  DG Chest Portable 1 View  Result Date: 05/13/2021 CLINICAL DATA:  Productive cough and chills, initial encounter EXAM: PORTABLE CHEST 1 VIEW COMPARISON:  None. FINDINGS: Cardiac shadow is within normal limits. Pacing device is seen in satisfactory position. Lungs are well aerated with mild right basilar atelectasis and small effusion. No other focal abnormality is seen. IMPRESSION: Mild right basilar atelectasis and effusion. Electronically Signed   By: MarkInez Catalina.   On: 05/13/2021 21:51   VAS US LK[place]ER EXTREMITY VENOUS (DVT) (ONLY [facility] & [facility])  Result Date: 05/14/2021  Lower Venous DVT Study Patient Name:  MELIJANESHA DADDIOte of Exam:   05/14/2021 Medical Rec #: [id number]    Accession #:    [id number] of Birth: 10/1205-11-1965    Patient Gender: F Patient Age:   55 y91rs [facility]      [facility] L[place]ER EXTREMITY VENOUS (DVT) Referring Phys: GEORInda Merlin-----------------------------------------------------------------------------  Indications: Pain.  Limitations: Body habitus and poor ultrasound/tissue interface. Comparison Study: no prior Performing Technologist: MegaArchie Patten  Examination Guidelines: A complete evaluation includes B-mode imaging, spectral Doppler, color Doppler, and power Doppler as needed of all accessible portions of each vessel. Bilateral testing is considered an integral part of a complete examination. Limited examinations for reoccurring indications may be performed as noted. The reflux portion of the exam is performed with the patient in reverse Trendelenburg.  +---------+---------------+---------+-----------+----------+-------------------+ RIGHT    CompressibilityPhasicitySpontaneityPropertiesThrombus Aging      +---------+---------------+---------+-----------+----------+-------------------+ CFV      Full           Yes      Yes                                       +---------+---------------+---------+-----------+----------+-------------------+ SFJ      Full                                                             +---------+---------------+---------+-----------+----------+-------------------+  FV Prox  Full                                                             +---------+---------------+---------+-----------+----------+-------------------+ FV Mid                  Yes      Yes                                      +---------+---------------+---------+-----------+----------+-------------------+ FV Distal                                             Not well visualized +---------+---------------+---------+-----------+----------+-------------------+ PFV      Full                                                             +---------+---------------+---------+-----------+----------+-------------------+ POP      Full           Yes      Yes                                      +---------+---------------+---------+-----------+----------+-------------------+ PTV      Full                                                             +---------+---------------+---------+-----------+----------+-------------------+ PERO                                                  Not well visualized +---------+---------------+---------+-----------+----------+-------------------+   +----+---------------+---------+-----------+----------+--------------+ LEFTCompressibilityPhasicitySpontaneityPropertiesThrombus Aging +----+---------------+---------+-----------+----------+--------------+ CFV Full           Yes      Yes                                 +----+---------------+---------+-----------+----------+--------------+    Summary: RIGHT: - There is no evidence of deep vein thrombosis in the lower extremity. However, portions of this examination were limited- see technologist comments above.  -  No cystic structure found in the popliteal fossa.  LEFT: - No evidence of common femoral vein obstruction.  *See table(s) above for measurements and observations.    Preliminary      Assessment and Plan:    Elevated troponin  - Hs-troponin 44>>148>>218.  EKG without acute ischemic changes.  Patient denies chest pain.  This is likely demand ischemia in setting of SIRS secondary to left lung pneumonia. -Will Check another troponin -Continue beta-blocker  and statin  2.  History of nonobstructive CAD -Cardiac cath in 10/2018 with 70% RPDA, 50% mid LAD and 70% distal OM 3 disease. -Not on aspirin due to need of anticoagulation -Continue beta-blocker and statin  3.  Atrial fibrillation -V paced rhythm.  Reports compliance with Eliquis.  No bleeding issue. -Cardiogram February 2022 showed LV function of 50 to 55% and grade 2 diastolic dysfunction -Continue Eliquis 5 mg twice daily  4.  Aortic stenosis s/p TAVR -Follow-up with routine echocardiogram  5.  Hypertension -Pressure stable and controlled on amlodipine and carvedilol  6.  Acute hypoxic respiratory failure secondary to pneumonia/SIRS - Per primary team  7. DM  - HGb A1C 8.5 - on Insulin   Risk Assessment/Risk Scores:   CHA2DS2-VASc Score = 7  This indicates a 11.2% annual risk of stroke. The patient's score is based upon: CHF History: Yes HTN History: Yes Diabetes History: Yes Stroke History: Yes Vascular Disease History: Yes Age Score: 0 Gender Score: 1   For questions or updates, please contact [place] Please consult [email or web address] for contact info under    Jarrett Soho, PA  05/14/2021 4:44 PM    I have examined the patient and reviewed assessment and plan and discussed with patient.  Agree with above as stated.    Will trend troponin.  Likely demand ischemia.  Known small vessel disease from prior cath.  No chest pain.  WOuld hold off on any further testing unless there was a significant  increase in troponin.   Continue risk factor modification including attempts at weight loss and DM control.   Larae Grooms

## 2021-05-14 NOTE — ED Notes (Signed)
Pt transported to floor via RN via stretcher with monitor.

## 2021-05-14 NOTE — Progress Notes (Signed)
Lower extremity venous has been completed.   Preliminary results in CV Proc.   Abram Sander 05/14/2021 11:00 AM

## 2021-05-14 NOTE — ED Notes (Signed)
ECG repeated as requested by a provider. Dr. Mal Misty messaged via chat that it is done.

## 2021-05-15 ENCOUNTER — Inpatient Hospital Stay (HOSPITAL_COMMUNITY): Payer: Medicare HMO

## 2021-05-15 DIAGNOSIS — I2510 Atherosclerotic heart disease of native coronary artery without angina pectoris: Secondary | ICD-10-CM | POA: Diagnosis not present

## 2021-05-15 DIAGNOSIS — R778 Other specified abnormalities of plasma proteins: Secondary | ICD-10-CM | POA: Diagnosis not present

## 2021-05-15 DIAGNOSIS — E1122 Type 2 diabetes mellitus with diabetic chronic kidney disease: Secondary | ICD-10-CM | POA: Diagnosis not present

## 2021-05-15 DIAGNOSIS — I248 Other forms of acute ischemic heart disease: Secondary | ICD-10-CM

## 2021-05-15 DIAGNOSIS — Z794 Long term (current) use of insulin: Secondary | ICD-10-CM

## 2021-05-15 DIAGNOSIS — I472 Ventricular tachycardia: Secondary | ICD-10-CM | POA: Diagnosis not present

## 2021-05-15 LAB — BASIC METABOLIC PANEL
Anion gap: 9 (ref 5–15)
BUN: 60 mg/dL — ABNORMAL HIGH (ref 6–20)
CO2: 23 mmol/L (ref 22–32)
Calcium: 8.5 mg/dL — ABNORMAL LOW (ref 8.9–10.3)
Chloride: 100 mmol/L (ref 98–111)
Creatinine, Ser: 2.44 mg/dL — ABNORMAL HIGH (ref 0.44–1.00)
GFR, Estimated: 23 mL/min — ABNORMAL LOW (ref >60)
Glucose, Bld: 285 mg/dL — ABNORMAL HIGH (ref 70–99)
Potassium: 4.3 mmol/L (ref 3.5–5.1)
Sodium: 132 mmol/L — ABNORMAL LOW (ref 135–145)

## 2021-05-15 LAB — ECHOCARDIOGRAM COMPLETE
AR max vel: 0.78 cm2
AV Area VTI: 0.78 cm2
AV Area mean vel: 0.78 cm2
AV Mean grad: 22.2 mmHg
AV Peak grad: 41.7 mmHg
Ao pk vel: 3.23 m/s
Area-P 1/2: 2.6 cm2
MV VTI: 0.77 cm2
S' Lateral: 2.6 cm
Weight: 6564.42 oz

## 2021-05-15 LAB — GLUCOSE, CAPILLARY
Glucose-Capillary: 188 mg/dL — ABNORMAL HIGH (ref 70–99)
Glucose-Capillary: 189 mg/dL — ABNORMAL HIGH (ref 70–99)
Glucose-Capillary: 165 mg/dL — ABNORMAL HIGH (ref 70–99)
Glucose-Capillary: 162 mg/dL — ABNORMAL HIGH (ref 70–99)

## 2021-05-15 LAB — MAGNESIUM: Magnesium: 2.2 mg/dL (ref 1.7–2.4)

## 2021-05-15 MED ORDER — PERFLUTREN LIPID MICROSPHERE
1.0000 mL | INTRAVENOUS | Status: AC | PRN
Start: 2021-05-15 — End: 2021-05-15
  Administered 2021-05-15: 3 mL via INTRAVENOUS
  Filled 2021-05-15: qty 10

## 2021-05-15 NOTE — Progress Notes (Addendum)
Progress Note    Carla Reynolds  [id number] DOB: Nov 14, 1965  DOA: 05/13/2021 PCP: Mosu, Darlin Coco, NP      Brief Narrative:    Medical records reviewed and are as summarized below:  Carla Reynolds is a 55 y.o. female with past medical history of chronic kidney disease stage IV, pulmonary embolism, obstructive sleep apnea, hypertension, hyperlipidemia, insulin-dependent diabetes mellitus type 2, moderate persistent asthma, coronary artery disease (Cath in 2020 with moderate multivessel disease), severe aortic stenosis (S/P TAVR 11/2018), pacemaker placement (11/2018), chronic diastolic congestive heart failure (Echo 01/2021 with grade 2 diastolic dysfunction).  She presented to the hospital with cough, shortness of breath, chills and subjective fever.  She also complained of nausea, generalized weakness and vomiting.      Assessment/Plan:   Principal Problem:   Sepsis ([place]) Active Problems:   Pneumonia of right lung due to infectious organism   CKD (chronic kidney disease), stage IV (HCC)   Type 2 diabetes mellitus with stage 4 chronic kidney disease, with long-term current use of insulin (HCC)   Mixed diabetic hyperlipidemia associated with type 2 diabetes mellitus (HCC)   Moderate persistent asthma without complication   Lymphedema   Coronary artery disease involving native coronary artery of native heart without angina pectoris   Essential hypertension   Lactic acidosis   Elevated troponin    There is no height or weight on file to calculate BMI.   Sepsis secondary to right basilar pneumonia, fever, leukocytosis: Continue empiric IV antibiotics.  Follow-up blood cultures.  Elevated troponins, CAD: Cardiologist was consulted for this.  Elevated troponins was attributed to demand ischemia.  Nonsustained ventricular tachycardia noted on telemetry: Ordered 2D echo for further evaluation.  History of pulmonary embolism: Continue Eliquis  CKD stage IV:  Creatinine is stable  Chronic diastolic CHF: Compensated  Other comorbidities include moderate persistent asthma, hypertension, OSA, morbid obesity (BMI greater than 50), s/p TAVR for severe aortic stenosis, s/p permanent pacemaker      Diet Order             Diet heart healthy/carb modified Room service appropriate? Yes; Fluid consistency: Thin  Diet effective now                      Consultants: Cardiologist  Procedures: None    Medications:    amLODipine  5 mg Oral Daily   apixaban  5 mg Oral BID   atorvastatin  80 mg Oral Daily   carvedilol  12.5 mg Oral BID WC   fluticasone furoate-vilanterol  1 puff Inhalation Daily   gabapentin  100 mg Oral TID   insulin aspart  0-20 Units Subcutaneous TID AC & HS   insulin aspart  10 Units Subcutaneous TID WC   insulin detemir  22 Units Subcutaneous BID   loratadine  10 mg Oral Daily   losartan  25 mg Oral Daily   prednisoLONE acetate  1 drop Left Eye TID AC & HS   Continuous Infusions:  cefTRIAXone (ROCEPHIN)  IV Stopped (05/14/21 1008)   doxycycline (VIBRAMYCIN) IV 100 mg (05/15/21 0651)     Anti-infectives (From admission, onward)    Start     Dose/Rate Route Frequency Ordered Stop   05/14/21 2200  ceFEPIme (MAXIPIME) 2 g in sodium chloride 0.9 % 100 mL IVPB  Status:  Discontinued        2 g 200 mL/hr over 30 Minutes Intravenous Every 24 hours 05/13/21 2301 05/14/21 0701  05/14/21 1800  doxycycline (VIBRAMYCIN) 100 mg in sodium chloride 0.9 % 250 mL IVPB        100 mg 125 mL/hr over 120 Minutes Intravenous Every 12 hours 05/14/21 0922     05/14/21 1000  cefTRIAXone (ROCEPHIN) 2 g in sodium chloride 0.9 % 100 mL IVPB        2 g 200 mL/hr over 30 Minutes Intravenous Every 24 hours 05/14/21 0306 05/19/21 0959   05/14/21 0400  azithromycin (ZITHROMAX) 500 mg in sodium chloride 0.9 % 250 mL IVPB  Status:  Discontinued        500 mg 250 mL/hr over 60 Minutes Intravenous Daily 05/14/21 0306 05/14/21 0922    05/13/21 2301  vancomycin variable dose per unstable renal function (pharmacist dosing)  Status:  Discontinued         Does not apply See admin instructions 05/13/21 2301 05/14/21 0701   05/13/21 2145  vancomycin (VANCOCIN) IVPB 1000 mg/200 mL premix        1,000 mg 200 mL/hr over 60 Minutes Intravenous  Once 05/13/21 2109 05/14/21 0143   05/13/21 2115  ceFEPIme (MAXIPIME) 2 g in sodium chloride 0.9 % 100 mL IVPB        2 g 200 mL/hr over 30 Minutes Intravenous  Once 05/13/21 2109 05/14/21 0058   05/13/21 2115  metroNIDAZOLE (FLAGYL) IVPB 500 mg        500 mg 100 mL/hr over 60 Minutes Intravenous  Once 05/13/21 2109 05/14/21 0015              Family Communication/Anticipated D/C date and plan/Code Status   DVT prophylaxis:  apixaban (ELIQUIS) tablet 5 mg     Code Status: Full Code  Family Communication: None Disposition Plan:    Status is: Inpatient  Remains inpatient appropriate because:IV treatments appropriate due to intensity of illness or inability to take PO and Inpatient level of care appropriate due to severity of illness  Dispo: The patient is from: Home              Anticipated d/c is to: Home              Patient currently is not medically stable to d/c.   Difficult to place patient No           Subjective:   Interval events noted.  She had episode of NSVT on telemetry. She had fever with T-max of 101.7 F yesterday. No chest pain, shortness of breath or cough.  She feels a little better today.  Objective:    Vitals:   05/15/21 0300 05/15/21 0442 05/15/21 0908 05/15/21 1121  BP:  (!) 132/37 (!) 102/43 (!) 124/55  Pulse:  88 84 77  Resp:  '17 20 18  '$ Temp:  99.6 F (37.6 C) 98.6 F (37 C) 99.5 F (37.5 C)  TempSrc:  Oral Oral Oral  SpO2:  99% 100% 100%  Weight: (!) 186.1 kg      No data found.   Intake/Output Summary (Last 24 hours) at 05/15/2021 1140 Last data filed at 05/15/2021 [id number] Gross per 24 hour  Intake 1161.25 ml  Output 50  ml  Net 1111.25 ml   Filed Weights   05/15/21 0300  Weight: (!) 186.1 kg    Exam:  GEN: NAD SKIN: Chronic wound on medial aspect of right mid thigh EYES: EOMI ENT: MMM CV: RRR PULM: No wheezing or rales heard. ABD: soft, obese, NT, +BS CNS: AAO x 3, non focal EXT: No  edema or tenderness. Right transmetatarsal amputee        Data Reviewed:   I have personally reviewed following labs and imaging studies:  Labs: Labs show the following:   Basic Metabolic Panel: Recent Labs  Lab 05/13/21 2135 05/14/21 1008 05/15/21 0037  NA 134* 133* 132*  K 4.3 4.9 4.3  CL 101 102 100  CO2 20* 16* 23  GLUCOSE 230* 275* 285*  BUN 62* 61* 60*  CREATININE 2.39* 2.38* 2.44*  CALCIUM 9.1 8.6* 8.5*  MG  --  2.0 2.2   GFR CrCl cannot be calculated (Unknown ideal weight.). Liver Function Tests: Recent Labs  Lab 05/13/21 2135 05/14/21 1008  AST 18 22  ALT 19 17  ALKPHOS 92 62  BILITOT 0.9 1.5*  PROT 8.6* 7.5  ALBUMIN 3.2* 2.7*   Recent Labs  Lab 05/13/21 2135  LIPASE 47   No results for input(s): AMMONIA in the last 168 hours. Coagulation profile No results for input(s): INR, PROTIME in the last 168 hours.  CBC: Recent Labs  Lab 05/13/21 2135 05/14/21 1008  WBC 15.8* 17.8*  NEUTROABS 14.2* 16.9*  HGB 12.7 11.7*  HCT 41.4 38.2  MCV 84.1 84.5  PLT 263 195   Cardiac Enzymes: No results for input(s): CKTOTAL, CKMB, CKMBINDEX, TROPONINI in the last 168 hours. BNP (last 3 results) No results for input(s): PROBNP in the last 8760 hours. CBG: Recent Labs  Lab 05/14/21 1243 05/14/21 1750 05/14/21 2147 05/15/21 0557 05/15/21 1122  GLUCAP 232* 252* 296* 188* 189*   D-Dimer: No results for input(s): DDIMER in the last 72 hours. Hgb A1c: Recent Labs    05/14/21 1009  HGBA1C 8.5*   Lipid Profile: No results for input(s): CHOL, HDL, LDLCALC, TRIG, CHOLHDL, LDLDIRECT in the last 72 hours. Thyroid function studies: No results for input(s): TSH, T4TOTAL,  T3FREE, THYROIDAB in the last 72 hours.  Invalid input(s): FREET3 Anemia work up: No results for input(s): VITAMINB12, FOLATE, FERRITIN, TIBC, IRON, RETICCTPCT in the last 72 hours. Sepsis Labs: Recent Labs  Lab 05/13/21 2135 05/14/21 0030 05/14/21 1008  WBC 15.8*  --  17.8*  LATICACIDVEN 2.0* 1.7  --     Microbiology Recent Results (from the past 240 hour(s))  Blood culture (routine x 2)     Status: None (Preliminary result)   Collection Time: 05/13/21 10:00 AM   Specimen: BLOOD  Result Value Ref Range Status   Specimen Description BLOOD BLOOD RIGHT HAND  Final   Special Requests   Final    BOTTLES DRAWN AEROBIC ONLY Blood Culture results may not be optimal due to an inadequate volume of blood received in culture bottles   Culture   Final    NO GROWTH <12 HOURS Performed at [facility], [place] [address] [place], [place] [address]    Report Status PENDING  Incomplete  Resp Panel by RT-PCR (Flu A&B, Covid) Nasopharyngeal Swab     Status: None   Collection Time: 05/13/21  9:35 PM   Specimen: Nasopharyngeal Swab; Nasopharyngeal(NP) swabs in vial transport medium  Result Value Ref Range Status   SARS Coronavirus 2 by RT PCR NEGATIVE NEGATIVE Final    Comment: (NOTE) SARS-CoV-2 target nucleic acids are NOT DETECTED.  The SARS-CoV-2 RNA is generally detectable in upper respiratory specimens during the acute phase of infection. The lowest concentration of SARS-CoV-2 viral copies this assay can detect is 138 copies/mL. A negative result does not preclude SARS-Cov-2 infection and should not be used as the sole  basis for treatment or other patient management decisions. A negative result may occur with  improper specimen collection/handling, submission of specimen other than nasopharyngeal swab, presence of viral mutation(s) within the areas targeted by this assay, and inadequate number of viral copies(<138 copies/mL). A negative result must be combined with clinical  observations, patient history, and epidemiological information. The expected result is Negative.  Fact Sheet for Patients:  EntrepreneurPulse.com.au  Fact Sheet for Healthcare Providers:  IncredibleEmployment.be  This test is no t yet approved or cleared by the [place] FDA and  has been authorized for detection and/or diagnosis of SARS-CoV-2 by FDA under an Emergency Use Authorization (EUA). This EUA will remain  in effect (meaning this test can be used) for the duration of the COVID-19 declaration under Section 564(b)(1) of the Act, 21 U.S.C.section 360bbb-3(b)(1), unless the authorization is terminated  or revoked sooner.       Influenza A by PCR NEGATIVE NEGATIVE Final   Influenza B by PCR NEGATIVE NEGATIVE Final    Comment: (NOTE) The Xpert Xpress SARS-CoV-2/FLU/RSV plus assay is intended as an aid in the diagnosis of influenza from Nasopharyngeal swab specimens and should not be used as a sole basis for treatment. Nasal washings and aspirates are unacceptable for Xpert Xpress SARS-CoV-2/FLU/RSV testing.  Fact Sheet for Patients: EntrepreneurPulse.com.au  Fact Sheet for Healthcare Providers: IncredibleEmployment.be  This test is not yet approved or cleared by the [place] FDA and has been authorized for detection and/or diagnosis of SARS-CoV-2 by FDA under an Emergency Use Authorization (EUA). This EUA will remain in effect (meaning this test can be used) for the duration of the COVID-19 declaration under Section 564(b)(1) of the Act, 21 U.S.C. section 360bbb-3(b)(1), unless the authorization is terminated or revoked.  Performed at [facility], [place] [address] [place], [place] [address]   Blood culture (routine x 2)     Status: None (Preliminary result)   Collection Time: 05/13/21  9:35 PM   Specimen: BLOOD  Result Value Ref Range Status   Specimen Description BLOOD LEFT  ANTECUBITAL  Final   Special Requests   Final    BOTTLES DRAWN AEROBIC AND ANAEROBIC Blood Culture results may not be optimal due to an excessive volume of blood received in culture bottles   Culture   Final    NO GROWTH < 12 HOURS Performed at [facility], [place] [address], [place], [place] [address]    Report Status PENDING  Incomplete    Procedures and diagnostic studies:  DG Chest Portable 1 View  Result Date: 05/13/2021 CLINICAL DATA:  Productive cough and chills, initial encounter EXAM: PORTABLE CHEST 1 VIEW COMPARISON:  None. FINDINGS: Cardiac shadow is within normal limits. Pacing device is seen in satisfactory position. Lungs are well aerated with mild right basilar atelectasis and small effusion. No other focal abnormality is seen. IMPRESSION: Mild right basilar atelectasis and effusion. Electronically Signed   By: Inez Catalina M.D.   On: 05/13/2021 21:51   VAS [place] LOWER EXTREMITY VENOUS (DVT) (ONLY [facility] & [facility])  Result Date: 05/14/2021  Lower Venous DVT Study Patient Name:  GEARLDEAN MCCURTY  Date of Exam:   05/14/2021 Medical Rec #: [id number]        Accession #:    [id number] Date of Birth: 01-19-1966        Patient Gender: F Patient Age:   38 years Exam Location:  [facility] Procedure:      VAS [place] LOWER EXTREMITY VENOUS (DVT)  Referring Phys: Inda Merlin --------------------------------------------------------------------------------  Indications: Pain.  Limitations: Body habitus and poor ultrasound/tissue interface. Comparison Study: no prior Performing Technologist: Archie Patten RVS  Examination Guidelines: A complete evaluation includes B-mode imaging, spectral Doppler, color Doppler, and power Doppler as needed of all accessible portions of each vessel. Bilateral testing is considered an integral part of a complete examination. Limited examinations for reoccurring indications may be performed as noted. The reflux portion of the exam is performed with the patient in  reverse Trendelenburg.  +---------+---------------+---------+-----------+----------+-------------------+ RIGHT    CompressibilityPhasicitySpontaneityPropertiesThrombus Aging      +---------+---------------+---------+-----------+----------+-------------------+ CFV      Full           Yes      Yes                                      +---------+---------------+---------+-----------+----------+-------------------+ SFJ      Full                                                             +---------+---------------+---------+-----------+----------+-------------------+ FV Prox  Full                                                             +---------+---------------+---------+-----------+----------+-------------------+ FV Mid                  Yes      Yes                                      +---------+---------------+---------+-----------+----------+-------------------+ FV Distal                                             Not well visualized +---------+---------------+---------+-----------+----------+-------------------+ PFV      Full                                                             +---------+---------------+---------+-----------+----------+-------------------+ POP      Full           Yes      Yes                                      +---------+---------------+---------+-----------+----------+-------------------+ PTV      Full                                                             +---------+---------------+---------+-----------+----------+-------------------+  PERO                                                  Not well visualized +---------+---------------+---------+-----------+----------+-------------------+   +----+---------------+---------+-----------+----------+--------------+ LEFTCompressibilityPhasicitySpontaneityPropertiesThrombus Aging +----+---------------+---------+-----------+----------+--------------+ CFV  Full           Yes      Yes                                 +----+---------------+---------+-----------+----------+--------------+    Summary: RIGHT: - There is no evidence of deep vein thrombosis in the lower extremity. However, portions of this examination were limited- see technologist comments above.  - No cystic structure found in the popliteal fossa.  LEFT: - No evidence of common femoral vein obstruction.  *See table(s) above for measurements and observations. Electronically signed by Harold Barban MD on 05/14/2021 at 10:42:32 PM.    Final                LOS: 1 day   Iylah Dworkin  [facility]   Pager on [email or web address]. If 7PM-7AM, please contact night-coverage at [email or web address]     05/15/2021, 11:40 AM

## 2021-05-15 NOTE — Progress Notes (Addendum)
Progress Note  Patient Name: Carie Heslop Date of Encounter: 05/15/2021  [facility] Cardiologist: None Cavender, Lottie Mussel, MD  @ [facility]  Subjective   No chest pain. Breathing improving.   Inpatient Medications    Scheduled Meds:  amLODipine  5 mg Oral Daily   apixaban  5 mg Oral BID   atorvastatin  80 mg Oral Daily   carvedilol  12.5 mg Oral BID WC   fluticasone furoate-vilanterol  1 puff Inhalation Daily   gabapentin  100 mg Oral TID   insulin aspart  0-20 Units Subcutaneous TID AC & HS   insulin aspart  10 Units Subcutaneous TID WC   insulin detemir  22 Units Subcutaneous BID   loratadine  10 mg Oral Daily   losartan  25 mg Oral Daily   prednisoLONE acetate  1 drop Left Eye TID AC & HS   Continuous Infusions:  cefTRIAXone (ROCEPHIN)  IV Stopped (05/14/21 1008)   doxycycline (VIBRAMYCIN) IV 100 mg (05/15/21 0651)   PRN Meds: acetaminophen **OR** acetaminophen, albuterol, fluticasone, loperamide, ondansetron **OR** ondansetron (ZOFRAN) IV, polyethylene glycol   Vital Signs    Vitals:   05/15/21 0029 05/15/21 0300 05/15/21 0442 05/15/21 0908  BP: (!) 123/43  (!) 132/37 (!) 102/43  Pulse: 77  88 84  Resp: '20  17 20  '$ Temp: 99.3 F (37.4 C)  99.6 F (37.6 C) 98.6 F (37 C)  TempSrc: Oral  Oral Oral  SpO2: 100%  99% 100%  Weight:  (!) 186.1 kg      Intake/Output Summary (Last 24 hours) at 05/15/2021 0922 Last data filed at 05/15/2021 0904 Gross per 24 hour  Intake 1161.25 ml  Output 50 ml  Net 1111.25 ml   Last 3 Weights 05/15/2021  Weight (lbs) 410 lb 4.4 oz  Weight (kg) 186.1 kg      Telemetry    V paced. NSVT x 1 (10 beats)  - Personally Reviewed  ECG    N/A  Physical Exam   GEN: No acute distress.   Neck: No JVD Cardiac: RRR, no murmurs, rubs, or gallops.  Respiratory: diminished breath sound  GI: Soft, nontender, non-distended  MS: No edema; No deformity. Neuro:  Nonfocal  Psych: Normal affect   Labs    High Sensitivity  Troponin:   Recent Labs  Lab 05/13/21 2135 05/14/21 0030 05/14/21 1008 05/14/21 1818 05/14/21 2045  TROPONINIHS 44* 148* 218* 184* 173*      Chemistry Recent Labs  Lab 05/13/21 2135 05/14/21 1008 05/15/21 0037  NA 134* 133* 132*  K 4.3 4.9 4.3  CL 101 102 100  CO2 20* 16* 23  GLUCOSE 230* 275* 285*  BUN 62* 61* 60*  CREATININE 2.39* 2.38* 2.44*  CALCIUM 9.1 8.6* 8.5*  PROT 8.6* 7.5  --   ALBUMIN 3.2* 2.7*  --   AST 18 22  --   ALT 19 17  --   ALKPHOS 92 62  --   BILITOT 0.9 1.5*  --   GFRNONAA 23* 24* 23*  ANIONGAP '13 15 9     '$ Hematology Recent Labs  Lab 05/13/21 2135 05/14/21 1008  WBC 15.8* 17.8*  RBC 4.92 4.52  HGB 12.7 11.7*  HCT 41.4 38.2  MCV 84.1 84.5  MCH 25.8* 25.9*  MCHC 30.7 30.6  RDW 15.7* 15.8*  PLT 263 195    BNP Recent Labs  Lab 05/13/21 2135  BNP 131.3*     Radiology    DG Chest Portable 1 View  Result Date: 05/13/2021 CLINICAL DATA:  Productive cough and chills, initial encounter EXAM: PORTABLE CHEST 1 VIEW COMPARISON:  None. FINDINGS: Cardiac shadow is within normal limits. Pacing device is seen in satisfactory position. Lungs are well aerated with mild right basilar atelectasis and small effusion. No other focal abnormality is seen. IMPRESSION: Mild right basilar atelectasis and effusion. Electronically Signed   By: Inez Catalina M.D.   On: 05/13/2021 21:51   VAS [place] LOWER EXTREMITY VENOUS (DVT) (ONLY [facility] & [facility])  Result Date: 05/14/2021  Lower Venous DVT Study Patient Name:  PAISLEE MARCHIONDA  Date of Exam:   05/14/2021 Medical Rec #: [id number]        Accession #:    [id number] Date of Birth: [birth year]        Patient Gender: F Patient Age:   55 years Exam Location:  [facility] Procedure:      VAS [place] LOWER EXTREMITY VENOUS (DVT) Referring Phys: Inda Merlin --------------------------------------------------------------------------------  Indications: Pain.  Limitations: Body habitus and poor ultrasound/tissue interface.  Comparison Study: no prior Performing Technologist: Archie Patten RVS  Examination Guidelines: A complete evaluation includes B-mode imaging, spectral Doppler, color Doppler, and power Doppler as needed of all accessible portions of each vessel. Bilateral testing is considered an integral part of a complete examination. Limited examinations for reoccurring indications may be performed as noted. The reflux portion of the exam is performed with the patient in reverse Trendelenburg.  +---------+---------------+---------+-----------+----------+-------------------+ RIGHT    CompressibilityPhasicitySpontaneityPropertiesThrombus Aging      +---------+---------------+---------+-----------+----------+-------------------+ CFV      Full           Yes      Yes                                      +---------+---------------+---------+-----------+----------+-------------------+ SFJ      Full                                                             +---------+---------------+---------+-----------+----------+-------------------+ FV Prox  Full                                                             +---------+---------------+---------+-----------+----------+-------------------+ FV Mid                  Yes      Yes                                      +---------+---------------+---------+-----------+----------+-------------------+ FV Distal                                             Not well visualized +---------+---------------+---------+-----------+----------+-------------------+ PFV      Full                                                             +---------+---------------+---------+-----------+----------+-------------------+  POP      Full           Yes      Yes                                      +---------+---------------+---------+-----------+----------+-------------------+ PTV      Full                                                              +---------+---------------+---------+-----------+----------+-------------------+ PERO                                                  Not well visualized +---------+---------------+---------+-----------+----------+-------------------+   +----+---------------+---------+-----------+----------+--------------+ LEFTCompressibilityPhasicitySpontaneityPropertiesThrombus Aging +----+---------------+---------+-----------+----------+--------------+ CFV Full           Yes      Yes                                 +----+---------------+---------+-----------+----------+--------------+    Summary: RIGHT: - There is no evidence of deep vein thrombosis in the lower extremity. However, portions of this examination were limited- see technologist comments above.  - No cystic structure found in the popliteal fossa.  LEFT: - No evidence of common femoral vein obstruction.  *See table(s) above for measurements and observations. Electronically signed by Harold Barban MD on 05/14/2021 at 10:42:32 PM.    Final     Cardiac Studies   N/A  Patient Profile     55 y.o. female with a hx of aortic stenosis s/p TVAR 11/2018, moderate non obstructive CAD, Chronic diastolic CHF, atrial fibrillation on Eliquis, diabetes mellitus, OSA on CPAP, hypertension, obesity, CVA, CHB s/p PPM, moderate persistent asthma, and CKD stage IV who is being seen  for the evaluation of Elevated troponin .  Assessment & Plan     Elevated troponin  - Hs-troponin peaked at 218 and then trended down.  EKG without acute ischemic changes.  - Patient denies chest pain. Her Elevated troponin is due to demand ischemia in setting of SIRS secondary to left lung pneumonia. No further work up needed.  -Continue beta-blocker and statin   2.  History of nonobstructive CAD -Cardiac cath in 10/2018 with 70% RPDA, 50% mid LAD and 70% distal OM 3 disease. -Not on aspirin due to need of anticoagulation -Continue beta-blocker and statin   3.  Atrial  fibrillation -V paced rhythm.  Reports compliance with Eliquis.  No bleeding issue. -Cardiogram February 2022 showed LV function of 50 to 55% and grade 2 diastolic dysfunction -Continue Eliquis 5 mg twice daily  [facility] will sign off.   Medication Recommendations:  Continue home medications  Other recommendations (labs, testing, etc):  None Follow up as an outpatient:  with primary cardiologist  For questions or updates, please contact [place] Please consult [email or web address] for contact info under        Signed, Leanor Kail, PA  05/15/2021, 9:22 AM      I have examined the patient and reviewed assessment and plan and discussed with patient.  Agree with above as stated.    Benign troponin pattern. Demand ischemia. COntinue preventive therapy and risk factor modification.   Larae Grooms

## 2021-05-15 NOTE — Progress Notes (Signed)
*  PRELIMINARY RESULTS* Echocardiogram 2D Echocardiogram with Definity has been performed.  Luisa Hart RDCS 05/15/2021, 11:10 AM

## 2021-05-15 NOTE — Progress Notes (Signed)
RT note. Patient placed on cpap of 14 per patient these are her home settings, with 2L bled in line. Patient resting comfortable, RT will continue to monitor

## 2021-05-16 DIAGNOSIS — A419 Sepsis, unspecified organism: Principal | ICD-10-CM

## 2021-05-16 LAB — GLUCOSE, CAPILLARY
Glucose-Capillary: 146 mg/dL — ABNORMAL HIGH (ref 70–99)
Glucose-Capillary: 190 mg/dL — ABNORMAL HIGH (ref 70–99)
Glucose-Capillary: 176 mg/dL — ABNORMAL HIGH (ref 70–99)
Glucose-Capillary: 180 mg/dL — ABNORMAL HIGH (ref 70–99)
Glucose-Capillary: 204 mg/dL — ABNORMAL HIGH (ref 70–99)

## 2021-05-16 NOTE — Plan of Care (Signed)
  Problem: Education: Goal: Ability to demonstrate management of disease process will improve Outcome: Progressing   Problem: Education: Goal: Ability to demonstrate management of disease process will improve Outcome: Progressing   Problem: Activity: Goal: Capacity to carry out activities will improve Outcome: Progressing   Problem: Cardiac: Goal: Ability to achieve and maintain adequate cardiopulmonary perfusion will improve Outcome: Progressing   Problem: Education: Goal: Knowledge of General Education information will improve Description: Including pain rating scale, medication(s)/side effects and non-pharmacologic comfort measures Outcome: Progressing

## 2021-05-16 NOTE — Progress Notes (Signed)
Pt placed on CPAP for the night at previous settings. Tol well

## 2021-05-16 NOTE — Progress Notes (Addendum)
Progress Note    Carla Reynolds  [id number] DOB: 04-28-1966  DOA: 05/13/2021 PCP: Mosu, Darlin Coco, NP      Brief Narrative:    Medical records reviewed and are as summarized below:  Carla Reynolds is a 55 y.o. female with past medical history of chronic kidney disease stage IV, pulmonary embolism, obstructive sleep apnea, hypertension, hyperlipidemia, insulin-dependent diabetes mellitus type 2, moderate persistent asthma, coronary artery disease (Cath in 2020 with moderate multivessel disease), severe aortic stenosis (S/P TAVR 11/2018), pacemaker placement (11/2018), chronic diastolic congestive heart failure (Echo 01/2021 with grade 2 diastolic dysfunction).  She presented to the hospital with cough, shortness of breath, chills and subjective fever.  She also complained of nausea, generalized weakness and vomiting.      Assessment/Plan:   Principal Problem:   Sepsis ([place]) Active Problems:   Pneumonia of right lung due to infectious organism   CKD (chronic kidney disease), stage IV (HCC)   Type 2 diabetes mellitus with stage 4 chronic kidney disease, with long-term current use of insulin (HCC)   Mixed diabetic hyperlipidemia associated with type 2 diabetes mellitus (HCC)   Moderate persistent asthma without complication   Lymphedema   Coronary artery disease involving native coronary artery of native heart without angina pectoris   Essential hypertension   Lactic acidosis   Elevated troponin    There is no height or weight on file to calculate BMI.   Sepsis secondary to right basilar pneumonia, fever, leukocytosis: Continue empiric IV antibiotics.  No growth on blood cultures thus far.  Check urinary antigens for Legionella and strep pneumonia.  Elevated troponins, CAD: Cardiologist was consulted for this.  Elevated troponins was attributed to demand ischemia.  History of pulmonary embolism: Continue Eliquis  CKD stage IV: Creatinine is stable  Chronic  diastolic CHF, episode of NSVT on telemetry on 05/15/2021: Compensated.  2D echo showed EF 40 to [id number], grade 3 diastolic dysfunction, moderate mitral stenosis  Acute hyponatremia: Monitor BMP.  Generalized weakness: Consult PT  Other comorbidities include moderate persistent asthma, hypertension, OSA, morbid obesity (BMI greater than 50), s/p TAVR for severe aortic stenosis, s/p permanent pacemaker      Diet Order             Diet heart healthy/carb modified Room service appropriate? Yes; Fluid consistency: Thin  Diet effective now                      Consultants: Cardiologist  Procedures: None    Medications:    amLODipine  5 mg Oral Daily   apixaban  5 mg Oral BID   atorvastatin  80 mg Oral Daily   carvedilol  12.5 mg Oral BID WC   fluticasone furoate-vilanterol  1 puff Inhalation Daily   gabapentin  100 mg Oral TID   insulin aspart  0-20 Units Subcutaneous TID AC & HS   insulin aspart  10 Units Subcutaneous TID WC   insulin detemir  22 Units Subcutaneous BID   loratadine  10 mg Oral Daily   losartan  25 mg Oral Daily   prednisoLONE acetate  1 drop Left Eye TID AC & HS   Continuous Infusions:  cefTRIAXone (ROCEPHIN)  IV Stopped (05/15/21 1228)   doxycycline (VIBRAMYCIN) IV 100 mg (05/16/21 0624)     Anti-infectives (From admission, onward)    Start     Dose/Rate Route Frequency Ordered Stop   05/14/21 2200  ceFEPIme (MAXIPIME) 2 g in sodium chloride 0.9 %  100 mL IVPB  Status:  Discontinued        2 g 200 mL/hr over 30 Minutes Intravenous Every 24 hours 05/13/21 2301 05/14/21 0701   05/14/21 1800  doxycycline (VIBRAMYCIN) 100 mg in sodium chloride 0.9 % 250 mL IVPB        100 mg 125 mL/hr over 120 Minutes Intravenous Every 12 hours 05/14/21 0922     05/14/21 1000  cefTRIAXone (ROCEPHIN) 2 g in sodium chloride 0.9 % 100 mL IVPB        2 g 200 mL/hr over 30 Minutes Intravenous Every 24 hours 05/14/21 0306 05/19/21 0959   05/14/21 0400  azithromycin  (ZITHROMAX) 500 mg in sodium chloride 0.9 % 250 mL IVPB  Status:  Discontinued        500 mg 250 mL/hr over 60 Minutes Intravenous Daily 05/14/21 0306 05/14/21 0922   05/13/21 2301  vancomycin variable dose per unstable renal function (pharmacist dosing)  Status:  Discontinued         Does not apply See admin instructions 05/13/21 2301 05/14/21 0701   05/13/21 2145  vancomycin (VANCOCIN) IVPB 1000 mg/200 mL premix        1,000 mg 200 mL/hr over 60 Minutes Intravenous  Once 05/13/21 2109 05/14/21 0143   05/13/21 2115  ceFEPIme (MAXIPIME) 2 g in sodium chloride 0.9 % 100 mL IVPB        2 g 200 mL/hr over 30 Minutes Intravenous  Once 05/13/21 2109 05/14/21 0058   05/13/21 2115  metroNIDAZOLE (FLAGYL) IVPB 500 mg        500 mg 100 mL/hr over 60 Minutes Intravenous  Once 05/13/21 2109 05/14/21 0015              Family Communication/Anticipated D/C date and plan/Code Status   DVT prophylaxis:  apixaban (ELIQUIS) tablet 5 mg     Code Status: Full Code  Family Communication: None Disposition Plan:    Status is: Inpatient  Remains inpatient appropriate because:IV treatments appropriate due to intensity of illness or inability to take PO and Inpatient level of care appropriate due to severity of illness  Dispo: The patient is from: Home              Anticipated d/c is to: Home              Patient currently is not medically stable to d/c.   Difficult to place patient No           Subjective:   She had fever last night with T-max of 100.4 F.  She feels weak.  No cough, chest pain or shortness of breath.  Objective:    Vitals:   05/15/21 2024 05/15/21 2358 05/16/21 0506 05/16/21 0932  BP: (!) 100/39 (!) 106/35 (!) 116/44   Pulse: 80 85 78   Resp: '20 20 20   '$ Temp: (!) 100.4 F (38 C) 99.3 F (37.4 C) 98.6 F (37 C)   TempSrc: Oral Oral Oral   SpO2: 100% 94% 99% 97%  Weight:   (!) 189.2 kg    No data found.   Intake/Output Summary (Last 24 hours) at  05/16/2021 1013 Last data filed at 05/16/2021 0624 Gross per 24 hour  Intake 1408 ml  Output 100 ml  Net 1308 ml   Filed Weights   05/15/21 0300 05/16/21 0506  Weight: (!) 186.1 kg (!) 189.2 kg    Exam:  GEN: NAD SKIN: Chronic wound on medial aspect of right mid thigh  EYES: EOMI ENT: MMM CV: RRR PULM: CTA B ABD: soft, obese, NT, +BS CNS: AAO x 3, non focal EXT: Chronic bilateral leg lymphedema.  Right transmetatarsal amputee.        Data Reviewed:   I have personally reviewed following labs and imaging studies:  Labs: Labs show the following:   Basic Metabolic Panel: Recent Labs  Lab 05/13/21 2135 05/14/21 1008 05/15/21 0037  NA 134* 133* 132*  K 4.3 4.9 4.3  CL 101 102 100  CO2 20* 16* 23  GLUCOSE 230* 275* 285*  BUN 62* 61* 60*  CREATININE 2.39* 2.38* 2.44*  CALCIUM 9.1 8.6* 8.5*  MG  --  2.0 2.2   GFR CrCl cannot be calculated (Unknown ideal weight.). Liver Function Tests: Recent Labs  Lab 05/13/21 2135 05/14/21 1008  AST 18 22  ALT 19 17  ALKPHOS 92 62  BILITOT 0.9 1.5*  PROT 8.6* 7.5  ALBUMIN 3.2* 2.7*   Recent Labs  Lab 05/13/21 2135  LIPASE 47   No results for input(s): AMMONIA in the last 168 hours. Coagulation profile No results for input(s): INR, PROTIME in the last 168 hours.  CBC: Recent Labs  Lab 05/13/21 2135 05/14/21 1008  WBC 15.8* 17.8*  NEUTROABS 14.2* 16.9*  HGB 12.7 11.7*  HCT 41.4 38.2  MCV 84.1 84.5  PLT 263 195   Cardiac Enzymes: No results for input(s): CKTOTAL, CKMB, CKMBINDEX, TROPONINI in the last 168 hours. BNP (last 3 results) No results for input(s): PROBNP in the last 8760 hours. CBG: Recent Labs  Lab 05/15/21 0557 05/15/21 1122 05/15/21 1631 05/15/21 2116 05/16/21 0545  GLUCAP 188* 189* 165* 162* 146*   D-Dimer: No results for input(s): DDIMER in the last 72 hours. Hgb A1c: Recent Labs    05/14/21 1009  HGBA1C 8.5*   Lipid Profile: No results for input(s): CHOL, HDL, LDLCALC,  TRIG, CHOLHDL, LDLDIRECT in the last 72 hours. Thyroid function studies: No results for input(s): TSH, T4TOTAL, T3FREE, THYROIDAB in the last 72 hours.  Invalid input(s): FREET3 Anemia work up: No results for input(s): VITAMINB12, FOLATE, FERRITIN, TIBC, IRON, RETICCTPCT in the last 72 hours. Sepsis Labs: Recent Labs  Lab 05/13/21 2135 05/14/21 0030 05/14/21 1008  WBC 15.8*  --  17.8*  LATICACIDVEN 2.0* 1.7  --     Microbiology Recent Results (from the past 240 hour(s))  Blood culture (routine x 2)     Status: None (Preliminary result)   Collection Time: 05/13/21 10:00 AM   Specimen: BLOOD  Result Value Ref Range Status   Specimen Description BLOOD BLOOD RIGHT HAND  Final   Special Requests   Final    BOTTLES DRAWN AEROBIC ONLY Blood Culture results may not be optimal due to an inadequate volume of blood received in culture bottles   Culture   Final    NO GROWTH 2 DAYS Performed at [facility], [place] [address] [place], [place] [address]    Report Status PENDING  Incomplete  Resp Panel by RT-PCR (Flu A&B, Covid) Nasopharyngeal Swab     Status: None   Collection Time: 05/13/21  9:35 PM   Specimen: Nasopharyngeal Swab; Nasopharyngeal(NP) swabs in vial transport medium  Result Value Ref Range Status   SARS Coronavirus 2 by RT PCR NEGATIVE NEGATIVE Final    Comment: (NOTE) SARS-CoV-2 target nucleic acids are NOT DETECTED.  The SARS-CoV-2 RNA is generally detectable in upper respiratory specimens during the acute phase of infection. The lowest concentration of SARS-CoV-2 viral copies  this assay can detect is 138 copies/mL. A negative result does not preclude SARS-Cov-2 infection and should not be used as the sole basis for treatment or other patient management decisions. A negative result may occur with  improper specimen collection/handling, submission of specimen other than nasopharyngeal swab, presence of viral mutation(s) within the areas targeted by this assay,  and inadequate number of viral copies(<138 copies/mL). A negative result must be combined with clinical observations, patient history, and epidemiological information. The expected result is Negative.  Fact Sheet for Patients:  EntrepreneurPulse.com.au  Fact Sheet for Healthcare Providers:  IncredibleEmployment.be  This test is no t yet approved or cleared by the [place] FDA and  has been authorized for detection and/or diagnosis of SARS-CoV-2 by FDA under an Emergency Use Authorization (EUA). This EUA will remain  in effect (meaning this test can be used) for the duration of the COVID-19 declaration under Section 564(b)(1) of the Act, 21 U.S.C.section 360bbb-3(b)(1), unless the authorization is terminated  or revoked sooner.       Influenza A by PCR NEGATIVE NEGATIVE Final   Influenza B by PCR NEGATIVE NEGATIVE Final    Comment: (NOTE) The Xpert Xpress SARS-CoV-2/FLU/RSV plus assay is intended as an aid in the diagnosis of influenza from Nasopharyngeal swab specimens and should not be used as a sole basis for treatment. Nasal washings and aspirates are unacceptable for Xpert Xpress SARS-CoV-2/FLU/RSV testing.  Fact Sheet for Patients: EntrepreneurPulse.com.au  Fact Sheet for Healthcare Providers: IncredibleEmployment.be  This test is not yet approved or cleared by the [place] FDA and has been authorized for detection and/or diagnosis of SARS-CoV-2 by FDA under an Emergency Use Authorization (EUA). This EUA will remain in effect (meaning this test can be used) for the duration of the COVID-19 declaration under Section 564(b)(1) of the Act, 21 U.S.C. section 360bbb-3(b)(1), unless the authorization is terminated or revoked.  Performed at [facility], [place] [address] [place], [place] [address]   Blood culture (routine x 2)     Status: None (Preliminary result)   Collection Time:  05/13/21  9:35 PM   Specimen: BLOOD  Result Value Ref Range Status   Specimen Description BLOOD LEFT ANTECUBITAL  Final   Special Requests   Final    BOTTLES DRAWN AEROBIC AND ANAEROBIC Blood Culture results may not be optimal due to an excessive volume of blood received in culture bottles   Culture   Final    NO GROWTH 3 DAYS Performed at [facility], [place] [address], [place], [place] [address]    Report Status PENDING  Incomplete    Procedures and diagnostic studies:  ECHOCARDIOGRAM COMPLETE  Result Date: 05/15/2021    ECHOCARDIOGRAM REPORT   Patient Name:   Carla Reynolds Date of Exam: 05/15/2021 Medical Rec #:  [id number]       Height:       68.0 in Accession #:    [id number]      Weight:       410.3 lb Date of Birth:  November 24, 1965       BSA:          2.774 m Patient Age:    63 years        BP:           132/37 mmHg Patient Gender: F               HR:           79 bpm. Exam Location:  Inpatient Procedure: 2D Echo, Cardiac Doppler, Color Doppler and Intracardiac            Opacification Agent Indications:     Ventricular Tachycardia  History:         Patient has no prior history of Echocardiogram examinations.                  CAD, Signs/Symptoms:Murmur; Risk Factors:Hypertension,                  Dyslipidemia and Diabetes. 12/08/2018 pacer                  05/14/2021 TAVR.  Sonographer:     Luisa Hart RDCS Referring Phys:  [place] Diagnosing Phys: Oswaldo Milian MD  Sonographer Comments: Patient is morbidly obese, suboptimal parasternal window, suboptimal apical window and suboptimal subcostal window. Image acquisition challenging due to patient body habitus. IMPRESSIONS  1. Very technically difficult study, even with contrast administratiom. Left ventricular ejection fraction, by estimation, is 40 to 45%. The left ventricle has mildly decreased function. Left ventricular endocardial border not optimally defined to evaluate regional wall motion. Left ventricular diastolic  parameters are consistent with Grade III diastolic dysfunction (restrictive). Elevated left atrial pressure.  2. Right ventricular systolic function was not well visualized. The right ventricular size is not well visualized. There is normal pulmonary artery systolic pressure. The estimated right ventricular systolic pressure is [id number] mmHg.  3. The mitral valve is abnormal. No evidence of mitral valve regurgitation. Moderate mitral stenosis. MG 7 mmHg at 78 bpm. Moderate mitral annular calcification.  4. There is a 26 mm Edwards Sapien prosthetic, stented (TAVR) valve present in the aortic position     Aortic valve regurgitation is not visualized. Echo findings are consistent with stenosis of the aortic prosthesis. MG 28 mmHg, Vmax 3.7 m/s, EOA 0.7 cm^2, AT 151m, DI 0.3.  5. The inferior vena cava is dilated in size with <50% respiratory variability, suggesting right atrial pressure of 15 mmHg. Conclusion(s)/Recommendation(s): Very difficult images. Systolic function appears reduced and appears to have elevated gradients through prosthetic aortic valve. Would consider TEE for better evaluation. FINDINGS  Left Ventricle: Left ventricular ejection fraction, by estimation, is 40 to 45%. The left ventricle has mildly decreased function. Left ventricular endocardial border not optimally defined to evaluate regional wall motion. Definity contrast agent was given IV to delineate the left ventricular endocardial borders. The left ventricular internal cavity size was normal in size. There is mild left ventricular hypertrophy. Left ventricular diastolic parameters are consistent with Grade III diastolic dysfunction (restrictive). Elevated left atrial pressure. Right Ventricle: The right ventricular size is not well visualized. Right vetricular wall thickness was not well visualized. Right ventricular systolic function was not well visualized. There is normal pulmonary artery systolic pressure. The tricuspid regurgitant  velocity is 2.19 m/s, and with an assumed right atrial pressure of 15 mmHg, the estimated right ventricular systolic pressure is [id number]. Left Atrium: Left atrial size was normal in size. Right Atrium: Right atrial size was normal in size. Pericardium: There is no evidence of pericardial effusion. Mitral Valve: The mitral valve is abnormal. Moderate mitral annular calcification. No evidence of mitral valve regurgitation. Moderate mitral valve stenosis. MV peak gradient, 14.6 mmHg. The mean mitral valve gradient is 7.0 mmHg. Tricuspid Valve: The tricuspid valve is normal in structure. Tricuspid valve regurgitation is trivial. Aortic Valve: The aortic valve has been repaired/replaced. Aortic valve regurgitation is not visualized. Aortic valve mean gradient measures 22.2  mmHg. Aortic valve peak gradient measures 41.7 mmHg. Aortic valve area, by VTI measures 0.78 cm. There is a  26 mm Edwards Sapien prosthetic, stented (TAVR) valve present in the aortic position. Pulmonic Valve: The pulmonic valve was not well visualized. Pulmonic valve regurgitation is not visualized. Aorta: The aortic root is normal in size and structure. Venous: The inferior vena cava is dilated in size with less than 50% respiratory variability, suggesting right atrial pressure of 15 mmHg. IAS/Shunts: The interatrial septum was not well visualized.  LEFT VENTRICLE PLAX 2D LVIDd:         4.70 cm  Diastology LVIDs:         2.60 cm  LV e' medial:    3.68 cm/s LV PW:         1.60 cm  LV E/e' medial:  45.9 LV IVS:        1.20 cm  LV e' lateral:   4.58 cm/s LVOT diam:     1.70 cm  LV E/e' lateral: 36.9 LV SV:         41 LV SV Index:   15 LVOT Area:     2.27 cm  LEFT ATRIUM             Index       RIGHT ATRIUM           Index LA diam:        3.70 cm 1.33 cm/m  RA Area:     18.50 cm LA Vol (A2C):   64.6 ml 23.29 ml/m RA Volume:   55.00 ml  19.83 ml/m LA Vol (A4C):   69.8 ml 25.16 ml/m LA Biplane Vol: 74.9 ml 27.00 ml/m  AORTIC VALVE                     PULMONIC VALVE AV Area (Vmax):    0.78 cm     PV Vmax:       1.03 m/s AV Area (Vmean):   0.78 cm     PV Vmean:      69.100 cm/s AV Area (VTI):     0.78 cm     PV VTI:        0.179 m AV Vmax:           322.76 cm/s  PV Peak grad:  4.2 mmHg AV Vmean:          214.084 cm/s PV Mean grad:  2.0 mmHg AV VTI:            0.525 m AV Peak Grad:      41.7 mmHg AV Mean Grad:      22.2 mmHg LVOT Vmax:         111.00 cm/s LVOT Vmean:        73.400 cm/s LVOT VTI:          0.180 m LVOT/AV VTI ratio: 0.34  AORTA Ao Root diam: 2.80 cm MITRAL VALVE                TRICUSPID VALVE MV Area (PHT): 2.60 cm     TR Peak grad:   19.2 mmHg MV Area VTI:   0.77 cm     TR Vmax:        219.00 cm/s MV Peak grad:  14.6 mmHg MV Mean grad:  7.0 mmHg     SHUNTS MV Vmax:       1.91 m/s     Systemic VTI:  0.18 m MV Vmean:  122.0 cm/s   Systemic Diam: 1.70 cm MV Decel Time: 292 msec MV E velocity: 169.00 cm/s MV A velocity: 72.30 cm/s MV E/A ratio:  2.34 Oswaldo Milian MD Electronically signed by Oswaldo Milian MD Signature Date/Time: 05/15/2021/3:21:17 PM    Final (Updated)    VAS [place] LOWER EXTREMITY VENOUS (DVT) (ONLY [facility] & [facility])  Result Date: 05/14/2021  Lower Venous DVT Study Patient Name:  Carla Reynolds  Date of Exam:   05/14/2021 Medical Rec #: [id number]        Accession #:    [id number] Date of Birth: Feb 22, 1966        Patient Gender: F Patient Age:   23 years Exam Location:  [facility] Procedure:      VAS [place] LOWER EXTREMITY VENOUS (DVT) Referring Phys: Inda Merlin --------------------------------------------------------------------------------  Indications: Pain.  Limitations: Body habitus and poor ultrasound/tissue interface. Comparison Study: no prior Performing Technologist: Archie Patten RVS  Examination Guidelines: A complete evaluation includes B-mode imaging, spectral Doppler, color Doppler, and power Doppler as needed of all accessible portions of each vessel. Bilateral testing is considered an integral  part of a complete examination. Limited examinations for reoccurring indications may be performed as noted. The reflux portion of the exam is performed with the patient in reverse Trendelenburg.  +---------+---------------+---------+-----------+----------+-------------------+ RIGHT    CompressibilityPhasicitySpontaneityPropertiesThrombus Aging      +---------+---------------+---------+-----------+----------+-------------------+ CFV      Full           Yes      Yes                                      +---------+---------------+---------+-----------+----------+-------------------+ SFJ      Full                                                             +---------+---------------+---------+-----------+----------+-------------------+ FV Prox  Full                                                             +---------+---------------+---------+-----------+----------+-------------------+ FV Mid                  Yes      Yes                                      +---------+---------------+---------+-----------+----------+-------------------+ FV Distal                                             Not well visualized +---------+---------------+---------+-----------+----------+-------------------+ PFV      Full                                                             +---------+---------------+---------+-----------+----------+-------------------+  POP      Full           Yes      Yes                                      +---------+---------------+---------+-----------+----------+-------------------+ PTV      Full                                                             +---------+---------------+---------+-----------+----------+-------------------+ PERO                                                  Not well visualized +---------+---------------+---------+-----------+----------+-------------------+    +----+---------------+---------+-----------+----------+--------------+ LEFTCompressibilityPhasicitySpontaneityPropertiesThrombus Aging +----+---------------+---------+-----------+----------+--------------+ CFV Full           Yes      Yes                                 +----+---------------+---------+-----------+----------+--------------+    Summary: RIGHT: - There is no evidence of deep vein thrombosis in the lower extremity. However, portions of this examination were limited- see technologist comments above.  - No cystic structure found in the popliteal fossa.  LEFT: - No evidence of common femoral vein obstruction.  *See table(s) above for measurements and observations. Electronically signed by Harold Barban MD on 05/14/2021 at 10:42:32 PM.    Final                LOS: 2 days   Jayd Forrey  [facility]   Pager on [email or web address]. If 7PM-7AM, please contact night-coverage at [email or web address]     05/16/2021, 10:13 AM

## 2021-05-16 NOTE — Evaluation (Signed)
Physical Therapy Evaluation Patient Details Name: Carla Reynolds MRN: [id number] DOB: 05/23/1966 Today's Date: 05/16/2021   History of Present Illness  55 y.o. female presents to [facility] ED on 05/13/2021 with cough, SOB, chills, nausea, weakness, and vomiting. Pt fund to have PNA of R lung. PMH includes chronic kidney disease stage IV, pulmonary embolism, OSA, hypertension, hyperlipidemia, insulin-dependent diabetes mellitus type 2, asthma, coronary artery disease, severe aortic stenosis (S/P TAVR 11/2018), pacemaker placement (11/2018), chronic diastolic congestive heart failure.  Clinical Impression  Pt presents to PT with deficits in functional mobility, gait, power, endurance, strength, cardiopulmonary function. Pt fatigues quickly during session and demonstrates generalized weakness which both limit mobility. Pt requires multiple attempts to rock to gain momentum into standing, and is unable to ambulate for household level distances at this time. PT encourages the pt to initiate bedside commode transfers for toileting with nursing staff to increase opportunities to mobilize. Acute PT services will continue to follow to progress gait training and activity tolerance. PT recommends return home to mothers house with home health PT pending continued mobility progression.    Follow Up Recommendations Home health PT;Supervision for mobility/OOB    Equipment Recommendations   (bariatric 3 in 1 commode)    Recommendations for Other Services       Precautions / Restrictions Precautions Precautions: Fall Precaution Comments: monitor SpO2 Restrictions Weight Bearing Restrictions: No      Mobility  Bed Mobility Overal bed mobility: Needs Assistance Bed Mobility: Supine to Sit;Sit to Supine     Supine to sit: Supervision;HOB elevated Sit to supine: Min assist   General bed mobility comments: minA for LE management    Transfers Overall transfer level: Needs assistance Equipment used:  4-wheeled walker Transfers: Sit to/from Stand Sit to Stand: Min guard         General transfer comment: multiple rocking attempts to gain momentum  Ambulation/Gait Ambulation/Gait assistance: Min guard Gait Distance (Feet): 3 Feet (3' forward and backward) Assistive device: 4-wheeled walker Gait Pattern/deviations: Step-to pattern Gait velocity: reduced Gait velocity interpretation: <1.31 ft/sec, indicative of household ambulator General Gait Details: pt with short step-to gait, reduced foot clearance bilaterally  Stairs            Wheelchair Mobility    Modified Rankin (Stroke Patients Only)       Balance Overall balance assessment: Needs assistance Sitting-balance support: No upper extremity supported;Feet supported Sitting balance-Leahy Scale: Fair     Standing balance support: Bilateral upper extremity supported Standing balance-Leahy Scale: Poor Standing balance comment: reliant on UE support of 4 wheeled walker                             Pertinent Vitals/Pain Pain Assessment: 0-10 Pain Score: 5  Pain Location: head Pain Descriptors / Indicators: Headache Pain Intervention(s): Monitored during session    Home Living Family/patient expects to be discharged to:: Private residence Living Arrangements: Other relatives (god-son, mother available if D/C to moms home) Available Help at Discharge: Family;Available PRN/intermittently Type of Home: Apartment Home Access: Stairs to enter Entrance Stairs-Rails: Can reach both Entrance Stairs-Number of Steps: flight Home Layout: One level Home Equipment: Walker - 2 wheels;Walker - 4 wheels;Cane - single point Additional Comments: pt also reports she has option to return to mothers home with ramp, 1 level    Prior Function Level of Independence: Needs assistance   Gait / Transfers Assistance Needed: pt ambulates for household distances with 4 wheeled walker with  seat. Fatigues quickly. Pt requires  assistance fro community level ADLs.           Hand Dominance        Extremity/Trunk Assessment   Upper Extremity Assessment Upper Extremity Assessment: Overall WFL for tasks assessed    Lower Extremity Assessment Lower Extremity Assessment: Generalized weakness    Cervical / Trunk Assessment Cervical / Trunk Assessment: Other exceptions Cervical / Trunk Exceptions: morbid obesity  Communication   Communication: No difficulties  Cognition Arousal/Alertness: Awake/alert Behavior During Therapy: WFL for tasks assessed/performed Overall Cognitive Status: Within Functional Limits for tasks assessed                                        General Comments General comments (skin integrity, edema, etc.): pt on 3L [place] upon arrival, PT able to wean to room air with sats from 95-96% with mobility. Pt reports dizziness with initial stand attempt, BP 120/40. Dizziness resolves with sitting rest break.    Exercises     Assessment/Plan    PT Assessment Patient needs continued PT services  PT Problem List Decreased strength;Decreased activity tolerance;Decreased balance;Decreased mobility;Cardiopulmonary status limiting activity;Pain       PT Treatment Interventions DME instruction;Gait training;Stair training;Functional mobility training;Therapeutic activities;Therapeutic exercise;Balance training;Neuromuscular re-education;Patient/family education    PT Goals (Current goals can be found in the Care Plan section)  Acute Rehab PT Goals Patient Stated Goal: to return to moms home and regain strength PT Goal Formulation: With patient Time For Goal Achievement: 05/30/21 Potential to Achieve Goals: Good    Frequency Min 3X/week   Barriers to discharge        Co-evaluation               AM-PAC PT "6 Clicks" Mobility  Outcome Measure Help needed turning from your back to your side while in a flat bed without using bedrails?: A Little Help needed moving  from lying on your back to sitting on the side of a flat bed without using bedrails?: A Little Help needed moving to and from a bed to a chair (including a wheelchair)?: A Little Help needed standing up from a chair using your arms (e.g., wheelchair or bedside chair)?: A Little Help needed to walk in hospital room?: A Lot Help needed climbing 3-5 steps with a railing? : A Lot 6 Click Score: 16    End of Session Equipment Utilized During Treatment: Oxygen Activity Tolerance: Patient limited by fatigue Patient left: in bed;with call bell/phone within reach Nurse Communication: Mobility status PT Visit Diagnosis: Other abnormalities of gait and mobility (R26.89);Muscle weakness (generalized) (M62.81)    Time: [id number] PT Time Calculation (min) (ACUTE ONLY): 36 min   Charges:   PT Evaluation $PT Eval Low Complexity: 1 Low          Zenaida Niece, PT, DPT Acute Rehabilitation Pager: [phone]   Zenaida Niece 05/16/2021, 12:40 PM

## 2021-05-16 NOTE — Plan of Care (Signed)
  Problem: Pain Managment: Goal: General experience of comfort will improve Outcome: Completed/Met

## 2021-05-17 LAB — CBC WITH DIFFERENTIAL/PLATELET
Abs Immature Granulocytes: 0.18 10*3/uL — ABNORMAL HIGH (ref 0.00–0.07)
Basophils Absolute: 0.1 10*3/uL (ref 0.0–0.1)
Basophils Relative: 0 %
Eosinophils Absolute: 0.6 10*3/uL — ABNORMAL HIGH (ref 0.0–0.5)
Eosinophils Relative: 4 %
HCT: 31.4 % — ABNORMAL LOW (ref 36.0–46.0)
Hemoglobin: 9.9 g/dL — ABNORMAL LOW (ref 12.0–15.0)
Immature Granulocytes: 1 %
Lymphocytes Relative: 7 %
Lymphs Abs: 1.1 10*3/uL (ref 0.7–4.0)
MCH: 25.9 pg — ABNORMAL LOW (ref 26.0–34.0)
MCHC: 31.5 g/dL (ref 30.0–36.0)
MCV: 82.2 fL (ref 80.0–100.0)
Monocytes Absolute: 1.3 10*3/uL — ABNORMAL HIGH (ref 0.1–1.0)
Monocytes Relative: 8 %
Neutro Abs: 12.9 10*3/uL — ABNORMAL HIGH (ref 1.7–7.7)
Neutrophils Relative %: 80 %
Platelets: 206 10*3/uL (ref 150–400)
RBC: 3.82 MIL/uL — ABNORMAL LOW (ref 3.87–5.11)
RDW: 15.8 % — ABNORMAL HIGH (ref 11.5–15.5)
WBC: 16.1 10*3/uL — ABNORMAL HIGH (ref 4.0–10.5)
nRBC: 0.1 % (ref 0.0–0.2)

## 2021-05-17 LAB — GLUCOSE, CAPILLARY
Glucose-Capillary: 144 mg/dL — ABNORMAL HIGH (ref 70–99)
Glucose-Capillary: 209 mg/dL — ABNORMAL HIGH (ref 70–99)
Glucose-Capillary: 195 mg/dL — ABNORMAL HIGH (ref 70–99)
Glucose-Capillary: 138 mg/dL — ABNORMAL HIGH (ref 70–99)

## 2021-05-17 LAB — BASIC METABOLIC PANEL
Anion gap: 9 (ref 5–15)
Anion gap: 12 (ref 5–15)
BUN: 84 mg/dL — ABNORMAL HIGH (ref 6–20)
BUN: 92 mg/dL — ABNORMAL HIGH (ref 6–20)
CO2: 19 mmol/L — ABNORMAL LOW (ref 22–32)
CO2: 15 mmol/L — ABNORMAL LOW (ref 22–32)
Calcium: 8.3 mg/dL — ABNORMAL LOW (ref 8.9–10.3)
Calcium: 8.1 mg/dL — ABNORMAL LOW (ref 8.9–10.3)
Chloride: 102 mmol/L (ref 98–111)
Chloride: 103 mmol/L (ref 98–111)
Creatinine, Ser: 2.72 mg/dL — ABNORMAL HIGH (ref 0.44–1.00)
Creatinine, Ser: 2.61 mg/dL — ABNORMAL HIGH (ref 0.44–1.00)
GFR, Estimated: 20 mL/min — ABNORMAL LOW (ref >60)
GFR, Estimated: 21 mL/min — ABNORMAL LOW (ref >60)
Glucose, Bld: 185 mg/dL — ABNORMAL HIGH (ref 70–99)
Glucose, Bld: 221 mg/dL — ABNORMAL HIGH (ref 70–99)
Potassium: 5 mmol/L (ref 3.5–5.1)
Potassium: 5 mmol/L (ref 3.5–5.1)
Sodium: 130 mmol/L — ABNORMAL LOW (ref 135–145)
Sodium: 130 mmol/L — ABNORMAL LOW (ref 135–145)

## 2021-05-17 LAB — STREP PNEUMONIAE URINARY ANTIGEN: Strep Pneumo Urinary Antigen: NEGATIVE

## 2021-05-17 MED ORDER — SODIUM CHLORIDE 0.9 % IV SOLN: INTRAVENOUS | Status: AC

## 2021-05-17 MED ORDER — SODIUM CHLORIDE 0.9 % IV SOLN: INTRAVENOUS | Status: DC

## 2021-05-17 MED ORDER — SODIUM CHLORIDE 0.9 % IV BOLUS
500.0000 mL | Freq: Once | INTRAVENOUS | Status: AC
  Administered 2021-05-17: 500 mL via INTRAVENOUS

## 2021-05-17 NOTE — Progress Notes (Addendum)
Progress Note    Emalin Delille  [id number] DOB: November 25, 1965  DOA: 05/13/2021 PCP: Mosu, Darlin Coco, NP      Brief Narrative:    Medical records reviewed and are as summarized below:  Maryori Stamour is a 55 y.o. female with past medical history of chronic kidney disease stage IV, pulmonary embolism, obstructive sleep apnea, hypertension, hyperlipidemia, insulin-dependent diabetes mellitus type 2, moderate persistent asthma, coronary artery disease (Cath in 2020 with moderate multivessel disease), severe aortic stenosis (S/P TAVR 11/2018), pacemaker placement (11/2018), chronic diastolic congestive heart failure (Echo 01/2021 with grade 2 diastolic dysfunction).  She presented to the hospital with cough, shortness of breath, chills and subjective fever.  She also complained of nausea, generalized weakness and vomiting.      Assessment/Plan:   Principal Problem:   Sepsis ([place]) Active Problems:   Pneumonia of right lung due to infectious organism   CKD (chronic kidney disease), stage IV (HCC)   Type 2 diabetes mellitus with stage 4 chronic kidney disease, with long-term current use of insulin (HCC)   Mixed diabetic hyperlipidemia associated with type 2 diabetes mellitus (HCC)   Moderate persistent asthma without complication   Lymphedema   Coronary artery disease involving native coronary artery of native heart without angina pectoris   Essential hypertension   Lactic acidosis   Elevated troponin    There is no height or weight on file to calculate BMI.   Sepsis secondary to right basilar pneumonia, fever, leukocytosis:   No growth on blood cultures thus far.  Strep pneumo urine antigen negative.  Legionella urine antigen is pending.  Continue empiric IV antibiotics.  Elevated troponins, CAD: Cardiologist was consulted for this.  Elevated troponins was attributed to demand ischemia.  No additional cardiac work-up.  History of pulmonary embolism: Continue  Eliquis  AKI on CKD stage IV: Start IV fluids.  Repeat BMP tomorrow.  Chronic diastolic CHF, episode of NSVT on telemetry on 05/15/2021: Compensated.  2D echo showed EF 40 to [id number], grade 3 diastolic dysfunction, moderate mitral stenosis  Acute hyponatremia: Monitor BMP.  BUN and creatinine have increased.  Hydrate with IV fluids.  Repeat BMP tomorrow.  Chronic diastolic CHF: Bumex has been on hold since admission.  Generalized weakness: PT recommends home health therapy.  Other comorbidities include moderate persistent asthma, hypertension, OSA, morbid obesity (BMI greater than 50), s/p TAVR for severe aortic stenosis, s/p permanent pacemaker      Diet Order             Diet heart healthy/carb modified Room service appropriate? Yes; Fluid consistency: Thin  Diet effective now                      Consultants: Cardiologist  Procedures: None    Medications:    amLODipine  5 mg Oral Daily   apixaban  5 mg Oral BID   atorvastatin  80 mg Oral Daily   carvedilol  12.5 mg Oral BID WC   fluticasone furoate-vilanterol  1 puff Inhalation Daily   gabapentin  100 mg Oral TID   insulin aspart  0-20 Units Subcutaneous TID AC & HS   insulin aspart  10 Units Subcutaneous TID WC   insulin detemir  22 Units Subcutaneous BID   loratadine  10 mg Oral Daily   prednisoLONE acetate  1 drop Left Eye TID AC & HS   Continuous Infusions:  sodium chloride     cefTRIAXone (ROCEPHIN)  IV 2 g (05/17/21  0916)   doxycycline (VIBRAMYCIN) IV 125 mL/hr at 05/17/21 0545     Anti-infectives (From admission, onward)    Start     Dose/Rate Route Frequency Ordered Stop   05/14/21 2200  ceFEPIme (MAXIPIME) 2 g in sodium chloride 0.9 % 100 mL IVPB  Status:  Discontinued        2 g 200 mL/hr over 30 Minutes Intravenous Every 24 hours 05/13/21 2301 05/14/21 0701   05/14/21 1800  doxycycline (VIBRAMYCIN) 100 mg in sodium chloride 0.9 % 250 mL IVPB        100 mg 125 mL/hr over 120 Minutes  Intravenous Every 12 hours 05/14/21 0922     05/14/21 1000  cefTRIAXone (ROCEPHIN) 2 g in sodium chloride 0.9 % 100 mL IVPB        2 g 200 mL/hr over 30 Minutes Intravenous Every 24 hours 05/14/21 0306 05/19/21 0959   05/14/21 0400  azithromycin (ZITHROMAX) 500 mg in sodium chloride 0.9 % 250 mL IVPB  Status:  Discontinued        500 mg 250 mL/hr over 60 Minutes Intravenous Daily 05/14/21 0306 05/14/21 0922   05/13/21 2301  vancomycin variable dose per unstable renal function (pharmacist dosing)  Status:  Discontinued         Does not apply See admin instructions 05/13/21 2301 05/14/21 0701   05/13/21 2145  vancomycin (VANCOCIN) IVPB 1000 mg/200 mL premix        1,000 mg 200 mL/hr over 60 Minutes Intravenous  Once 05/13/21 2109 05/14/21 0143   05/13/21 2115  ceFEPIme (MAXIPIME) 2 g in sodium chloride 0.9 % 100 mL IVPB        2 g 200 mL/hr over 30 Minutes Intravenous  Once 05/13/21 2109 05/14/21 0058   05/13/21 2115  metroNIDAZOLE (FLAGYL) IVPB 500 mg        500 mg 100 mL/hr over 60 Minutes Intravenous  Once 05/13/21 2109 05/14/21 0015              Family Communication/Anticipated D/C date and plan/Code Status   DVT prophylaxis:  apixaban (ELIQUIS) tablet 5 mg     Code Status: Full Code  Family Communication: None Disposition Plan:    Status is: Inpatient  Remains inpatient appropriate because:IV treatments appropriate due to intensity of illness or inability to take PO and Inpatient level of care appropriate due to severity of illness  Dispo: The patient is from: Home              Anticipated d/c is to: Home              Patient currently is not medically stable to d/c.   Difficult to place patient No           Subjective:   Interval events noted.  She complains of poor oral intake.  No fever, abdominal pain, vomiting or diarrhea.  No cough, chest pain or shortness of breath.  Objective:    Vitals:   05/16/21 2022 05/17/21 0013 05/17/21 0421 05/17/21  1056  BP: (!) 119/45 (!) 110/42 (!) 108/59 (!) 121/55  Pulse: 76 79 71 74  Resp: '20 20 20 18  '$ Temp: 99.7 F (37.6 C) 99.5 F (37.5 C) 98.4 F (36.9 C) 99 F (37.2 C)  TempSrc: Oral Oral Oral Oral  SpO2: 94% 99% 99% 98%  Weight:   (!) 192.2 kg    No data found.   Intake/Output Summary (Last 24 hours) at 05/17/2021 1637 Last data filed at  05/17/2021 1542 Gross per 24 hour  Intake 1049.91 ml  Output 1050 ml  Net -0.09 ml   Filed Weights   05/15/21 0300 05/16/21 0506 05/17/21 0421  Weight: (!) 186.1 kg (!) 189.2 kg (!) 192.2 kg    Exam:  GEN: NAD SKIN: Warm and dry.  Chronic wound on medial aspect of right mid thigh. EYES: No pallor or icterus ENT: MMM CV: RRR PULM: CTA B ABD: soft, obese, NT, +BS CNS: AAO x 3, non focal EXT: Bilateral lower extremity lymphedema.  No tenderness.  Right transmetatarsal amputee.        Data Reviewed:   I have personally reviewed following labs and imaging studies:  Labs: Labs show the following:   Basic Metabolic Panel: Recent Labs  Lab 05/13/21 2135 05/14/21 1008 05/15/21 0037 05/17/21 0237 05/17/21 1534  NA 134* 133* 132* 130* 130*  K 4.3 4.9 4.3 5.0 5.0  CL 101 102 100 102 103  CO2 20* 16* 23 19* 15*  GLUCOSE 230* 275* 285* 185* 221*  BUN 62* 61* 60* 84* 92*  CREATININE 2.39* 2.38* 2.44* 2.72* 2.61*  CALCIUM 9.1 8.6* 8.5* 8.3* 8.1*  MG  --  2.0 2.2  --   --    GFR CrCl cannot be calculated (Unknown ideal weight.). Liver Function Tests: Recent Labs  Lab 05/13/21 2135 05/14/21 1008  AST 18 22  ALT 19 17  ALKPHOS 92 62  BILITOT 0.9 1.5*  PROT 8.6* 7.5  ALBUMIN 3.2* 2.7*   Recent Labs  Lab 05/13/21 2135  LIPASE 47   No results for input(s): AMMONIA in the last 168 hours. Coagulation profile No results for input(s): INR, PROTIME in the last 168 hours.  CBC: Recent Labs  Lab 05/13/21 2135 05/14/21 1008 05/17/21 0237  WBC 15.8* 17.8* 16.1*  NEUTROABS 14.2* 16.9* 12.9*  HGB 12.7 11.7* 9.9*  HCT  41.4 38.2 31.4*  MCV 84.1 84.5 82.2  PLT 263 195 206   Cardiac Enzymes: No results for input(s): CKTOTAL, CKMB, CKMBINDEX, TROPONINI in the last 168 hours. BNP (last 3 results) No results for input(s): PROBNP in the last 8760 hours. CBG: Recent Labs  Lab 05/16/21 1144 05/16/21 1652 05/16/21 2131 05/17/21 0554 05/17/21 1123  GLUCAP 176* 180* 204* 144* 209*   D-Dimer: No results for input(s): DDIMER in the last 72 hours. Hgb A1c: No results for input(s): HGBA1C in the last 72 hours.  Lipid Profile: No results for input(s): CHOL, HDL, LDLCALC, TRIG, CHOLHDL, LDLDIRECT in the last 72 hours. Thyroid function studies: No results for input(s): TSH, T4TOTAL, T3FREE, THYROIDAB in the last 72 hours.  Invalid input(s): FREET3 Anemia work up: No results for input(s): VITAMINB12, FOLATE, FERRITIN, TIBC, IRON, RETICCTPCT in the last 72 hours. Sepsis Labs: Recent Labs  Lab 05/13/21 2135 05/14/21 0030 05/14/21 1008 05/17/21 0237  WBC 15.8*  --  17.8* 16.1*  LATICACIDVEN 2.0* 1.7  --   --     Microbiology Recent Results (from the past 240 hour(s))  Blood culture (routine x 2)     Status: None (Preliminary result)   Collection Time: 05/13/21 10:00 AM   Specimen: BLOOD  Result Value Ref Range Status   Specimen Description BLOOD BLOOD RIGHT HAND  Final   Special Requests   Final    BOTTLES DRAWN AEROBIC ONLY Blood Culture results may not be optimal due to an inadequate volume of blood received in culture bottles   Culture   Final    NO GROWTH 3 DAYS Performed at  [facility], [place] [address] [place], [place] [address]    Report Status PENDING  Incomplete  Resp Panel by RT-PCR (Flu A&B, Covid) Nasopharyngeal Swab     Status: None   Collection Time: 05/13/21  9:35 PM   Specimen: Nasopharyngeal Swab; Nasopharyngeal(NP) swabs in vial transport medium  Result Value Ref Range Status   SARS Coronavirus 2 by RT PCR NEGATIVE NEGATIVE Final    Comment: (NOTE) SARS-CoV-2 target  nucleic acids are NOT DETECTED.  The SARS-CoV-2 RNA is generally detectable in upper respiratory specimens during the acute phase of infection. The lowest concentration of SARS-CoV-2 viral copies this assay can detect is 138 copies/mL. A negative result does not preclude SARS-Cov-2 infection and should not be used as the sole basis for treatment or other patient management decisions. A negative result may occur with  improper specimen collection/handling, submission of specimen other than nasopharyngeal swab, presence of viral mutation(s) within the areas targeted by this assay, and inadequate number of viral copies(<138 copies/mL). A negative result must be combined with clinical observations, patient history, and epidemiological information. The expected result is Negative.  Fact Sheet for Patients:  EntrepreneurPulse.com.au  Fact Sheet for Healthcare Providers:  IncredibleEmployment.be  This test is no t yet approved or cleared by the [place] FDA and  has been authorized for detection and/or diagnosis of SARS-CoV-2 by FDA under an Emergency Use Authorization (EUA). This EUA will remain  in effect (meaning this test can be used) for the duration of the COVID-19 declaration under Section 564(b)(1) of the Act, 21 U.S.C.section 360bbb-3(b)(1), unless the authorization is terminated  or revoked sooner.       Influenza A by PCR NEGATIVE NEGATIVE Final   Influenza B by PCR NEGATIVE NEGATIVE Final    Comment: (NOTE) The Xpert Xpress SARS-CoV-2/FLU/RSV plus assay is intended as an aid in the diagnosis of influenza from Nasopharyngeal swab specimens and should not be used as a sole basis for treatment. Nasal washings and aspirates are unacceptable for Xpert Xpress SARS-CoV-2/FLU/RSV testing.  Fact Sheet for Patients: EntrepreneurPulse.com.au  Fact Sheet for Healthcare  Providers: IncredibleEmployment.be  This test is not yet approved or cleared by the [place] FDA and has been authorized for detection and/or diagnosis of SARS-CoV-2 by FDA under an Emergency Use Authorization (EUA). This EUA will remain in effect (meaning this test can be used) for the duration of the COVID-19 declaration under Section 564(b)(1) of the Act, 21 U.S.C. section 360bbb-3(b)(1), unless the authorization is terminated or revoked.  Performed at [facility], [place] [address], [place], [place] [address]   Blood culture (routine x 2)     Status: None (Preliminary result)   Collection Time: 05/13/21  9:35 PM   Specimen: BLOOD  Result Value Ref Range Status   Specimen Description BLOOD LEFT ANTECUBITAL  Final   Special Requests   Final    BOTTLES DRAWN AEROBIC AND ANAEROBIC Blood Culture results may not be optimal due to an excessive volume of blood received in culture bottles   Culture   Final    NO GROWTH 4 DAYS Performed at [facility], [place] [address], [place], [place] [address]    Report Status PENDING  Incomplete    Procedures and diagnostic studies:  No results found.             LOS: 3 days   Cameshia Cressman  [facility]   Pager on [email or web address]. If 7PM-7AM, please contact night-coverage at [email or web address]  05/17/2021, 4:37 PM

## 2021-05-18 LAB — GLUCOSE, CAPILLARY
Glucose-Capillary: 82 mg/dL (ref 70–99)
Glucose-Capillary: 139 mg/dL — ABNORMAL HIGH (ref 70–99)
Glucose-Capillary: 146 mg/dL — ABNORMAL HIGH (ref 70–99)

## 2021-05-18 LAB — CBC WITH DIFFERENTIAL/PLATELET
Abs Immature Granulocytes: 0.1 10*3/uL — ABNORMAL HIGH (ref 0.00–0.07)
Basophils Absolute: 0.1 10*3/uL (ref 0.0–0.1)
Basophils Relative: 1 %
Eosinophils Absolute: 0.7 10*3/uL — ABNORMAL HIGH (ref 0.0–0.5)
Eosinophils Relative: 5 %
HCT: 29.9 % — ABNORMAL LOW (ref 36.0–46.0)
Hemoglobin: 9.4 g/dL — ABNORMAL LOW (ref 12.0–15.0)
Immature Granulocytes: 1 %
Lymphocytes Relative: 9 %
Lymphs Abs: 1.1 10*3/uL (ref 0.7–4.0)
MCH: 26 pg (ref 26.0–34.0)
MCHC: 31.4 g/dL (ref 30.0–36.0)
MCV: 82.6 fL (ref 80.0–100.0)
Monocytes Absolute: 1.7 10*3/uL — ABNORMAL HIGH (ref 0.1–1.0)
Monocytes Relative: 13 %
Neutro Abs: 9.6 10*3/uL — ABNORMAL HIGH (ref 1.7–7.7)
Neutrophils Relative %: 71 %
Platelets: 201 10*3/uL (ref 150–400)
RBC: 3.62 MIL/uL — ABNORMAL LOW (ref 3.87–5.11)
RDW: 15.6 % — ABNORMAL HIGH (ref 11.5–15.5)
WBC: 13.2 10*3/uL — ABNORMAL HIGH (ref 4.0–10.5)
nRBC: 0.4 % — ABNORMAL HIGH (ref 0.0–0.2)

## 2021-05-18 LAB — BASIC METABOLIC PANEL
Anion gap: 9 (ref 5–15)
BUN: 89 mg/dL — ABNORMAL HIGH (ref 6–20)
CO2: 19 mmol/L — ABNORMAL LOW (ref 22–32)
Calcium: 8.3 mg/dL — ABNORMAL LOW (ref 8.9–10.3)
Chloride: 105 mmol/L (ref 98–111)
Creatinine, Ser: 2.34 mg/dL — ABNORMAL HIGH (ref 0.44–1.00)
GFR, Estimated: 24 mL/min — ABNORMAL LOW (ref >60)
Glucose, Bld: 86 mg/dL (ref 70–99)
Potassium: 4.2 mmol/L (ref 3.5–5.1)
Sodium: 133 mmol/L — ABNORMAL LOW (ref 135–145)

## 2021-05-18 LAB — CULTURE, BLOOD (ROUTINE X 2): Culture: NO GROWTH

## 2021-05-18 MED ORDER — CEFDINIR 300 MG PO CAPS: 300.0000 mg | ORAL_CAPSULE | Freq: Two times a day (BID) | ORAL | 0 refills | Status: AC

## 2021-05-18 MED ORDER — DOXYCYCLINE HYCLATE 100 MG PO CAPS: 100.0000 mg | ORAL_CAPSULE | Freq: Two times a day (BID) | ORAL | 0 refills | Status: AC

## 2021-05-18 NOTE — Discharge Summary (Signed)
Physician Discharge Summary  Carla Reynolds [id number] DOB: January 03, 1966 DOA: 05/13/2021  PCP: Hardin Negus, NP  Admit date: 05/13/2021 Discharge date: 05/18/2021  Discharge disposition: Home with home health therapy   Recommendations for Outpatient Follow-Up:   Follow-up with PCP in 1 week   Discharge Diagnosis:   Principal Problem:   Sepsis ([place]) Active Problems:   Pneumonia of right lung due to infectious organism   CKD (chronic kidney disease), stage IV ([place])   Type 2 diabetes mellitus with stage 4 chronic kidney disease, with long-term current use of insulin ([place])   Mixed diabetic hyperlipidemia associated with type 2 diabetes mellitus (H[facility])   Moderate persistent asthma without complication   Lymphedema   Coronary artery disease involving native coronary artery of native heart without angina pectoris   Essential hypertension   Lactic acidosis   Elevated troponin    Discharge Condition: Stable.  Diet recommendation:  Diet Order             Diet - low sodium heart healthy           Diet Carb Modified           Diet heart healthy/carb modified Room service appropriate? Yes; Fluid consistency: Thin  Diet effective now                     Code Status: Full Code     Hospital Course:    Ms. Carla Reynolds is a 55 y.o. female with past medical history of chronic kidney disease stage IV, pulmonary embolism, obstructive sleep apnea, hypertension, hyperlipidemia, insulin-dependent diabetes mellitus type 2, moderate persistent asthma, coronary artery disease (Cath in 2020 with moderate multivessel disease), severe aortic stenosis (S/P TAVR 11/2018), pacemaker placement (11/2018), chronic diastolic congestive heart failure (Echo 01/2021 with grade 2 diastolic dysfunction).  She presented to the hospital with cough, shortness of breath, chills and subjective fever.  She also complained of nausea, generalized weakness and vomiting.   She was admitted to  the hospital for sepsis and fracture right basilar pneumonia.  She developed AKI and was likely from dehydration and poor oral intake and this improved with IV fluids.  She had elevated troponins on admission.  Cardiology was consulted because of this.  However, elevated troponin was attributed to demand ischemia.  Her condition has improved and she is still stable for discharge to home today  Medical Consultants:   Cardiologist   Discharge Exam:    Vitals:   05/18/21 0501 05/18/21 0740 05/18/21 0747 05/18/21 1104  BP:  (!) 108/45  (!) 112/53  Pulse:  70  85  Resp:  19  18  Temp:  99.2 F (37.3 C)  99.3 F (37.4 C)  TempSrc:  Oral  Oral  SpO2:  98% 97% 96%  Weight: (!) 191.5 kg        GEN: NAD SKIN: Warm and dry.  Chronic wound on medial aspect of right mid thigh EYES: No pallor or icterus ENT: MMM CV: RRR PULM: CTA B ABD: soft, obese, NT, +BS CNS: AAO x 3, non focal EXT: Bilateral lower extremity lymphedema.  No tenderness.  Right transmetatarsal amputee.   The results of significant diagnostics from this hospitalization (including imaging, microbiology, ancillary and laboratory) are listed below for reference.     Procedures and Diagnostic Studies:   DG Chest Portable 1 View  Result Date: 05/13/2021 CLINICAL DATA:  Productive cough and chills, initial encounter EXAM: PORTABLE CHEST 1 VIEW COMPARISON:  None. FINDINGS:  Cardiac shadow is within normal limits. Pacing device is seen in satisfactory position. Lungs are well aerated with mild right basilar atelectasis and small effusion. No other focal abnormality is seen. IMPRESSION: Mild right basilar atelectasis and effusion. Electronically Signed   By: Inez Catalina M.D.   On: 05/13/2021 21:51   VAS [place] LOWER EXTREMITY VENOUS (DVT) (ONLY [facility] & [facility])  Result Date: 05/14/2021  Lower Venous DVT Study Patient Name:  Carla Reynolds  Date of Exam:   05/14/2021 Medical Rec #: [id number]        Accession #:    [id number] Date of Birth:  [birth year]        Patient Gender: F Patient Age:   45 years Exam Location:  [facility] Procedure:      VAS [place] LOWER EXTREMITY VENOUS (DVT) Referring Phys: Inda Merlin --------------------------------------------------------------------------------  Indications: Pain.  Limitations: Body habitus and poor ultrasound/tissue interface. Comparison Study: no prior Performing Technologist: Archie Patten RVS  Examination Guidelines: A complete evaluation includes B-mode imaging, spectral Doppler, color Doppler, and power Doppler as needed of all accessible portions of each vessel. Bilateral testing is considered an integral part of a complete examination. Limited examinations for reoccurring indications may be performed as noted. The reflux portion of the exam is performed with the patient in reverse Trendelenburg.  +---------+---------------+---------+-----------+----------+-------------------+ RIGHT    CompressibilityPhasicitySpontaneityPropertiesThrombus Aging      +---------+---------------+---------+-----------+----------+-------------------+ CFV      Full           Yes      Yes                                      +---------+---------------+---------+-----------+----------+-------------------+ SFJ      Full                                                             +---------+---------------+---------+-----------+----------+-------------------+ FV Prox  Full                                                             +---------+---------------+---------+-----------+----------+-------------------+ FV Mid                  Yes      Yes                                      +---------+---------------+---------+-----------+----------+-------------------+ FV Distal                                             Not well visualized +---------+---------------+---------+-----------+----------+-------------------+ PFV      Full                                                              +---------+---------------+---------+-----------+----------+-------------------+  POP      Full           Yes      Yes                                      +---------+---------------+---------+-----------+----------+-------------------+ PTV      Full                                                             +---------+---------------+---------+-----------+----------+-------------------+ PERO                                                  Not well visualized +---------+---------------+---------+-----------+----------+-------------------+   +----+---------------+---------+-----------+----------+--------------+ LEFTCompressibilityPhasicitySpontaneityPropertiesThrombus Aging +----+---------------+---------+-----------+----------+--------------+ CFV Full           Yes      Yes                                 +----+---------------+---------+-----------+----------+--------------+    Summary: RIGHT: - There is no evidence of deep vein thrombosis in the lower extremity. However, portions of this examination were limited- see technologist comments above.  - No cystic structure found in the popliteal fossa.  LEFT: - No evidence of common femoral vein obstruction.  *See table(s) above for measurements and observations. Electronically signed by Harold Barban MD on 05/14/2021 at 10:42:32 PM.    Final      Labs:   Basic Metabolic Panel: Recent Labs  Lab 05/14/21 1008 05/15/21 0037 05/17/21 0237 05/17/21 1534 05/18/21 0359  NA 133* 132* 130* 130* 133*  K 4.9 4.3 5.0 5.0 4.2  CL 102 100 102 103 105  CO2 16* 23 19* 15* 19*  GLUCOSE 275* 285* 185* 221* 86  BUN 61* 60* 84* 92* 89*  CREATININE 2.38* 2.44* 2.72* 2.61* 2.34*  CALCIUM 8.6* 8.5* 8.3* 8.1* 8.3*  MG 2.0 2.2  --   --   --    GFR CrCl cannot be calculated (Unknown ideal weight.). Liver Function Tests: Recent Labs  Lab 05/13/21 2135 05/14/21 1008  AST 18 22  ALT 19 17  ALKPHOS 92 62  BILITOT  0.9 1.5*  PROT 8.6* 7.5  ALBUMIN 3.2* 2.7*   Recent Labs  Lab 05/13/21 2135  LIPASE 47   No results for input(s): AMMONIA in the last 168 hours. Coagulation profile No results for input(s): INR, PROTIME in the last 168 hours.  CBC: Recent Labs  Lab 05/13/21 2135 05/14/21 1008 05/17/21 0237 05/18/21 0359  WBC 15.8* 17.8* 16.1* 13.2*  NEUTROABS 14.2* 16.9* 12.9* 9.6*  HGB 12.7 11.7* 9.9* 9.4*  HCT 41.4 38.2 31.4* 29.9*  MCV 84.1 84.5 82.2 82.6  PLT 263 195 206 201   Cardiac Enzymes: No results for input(s): CKTOTAL, CKMB, CKMBINDEX, TROPONINI in the last 168 hours. BNP: Invalid input(s): POCBNP CBG: Recent Labs  Lab 05/17/21 1123 05/17/21 1649 05/17/21 2121 05/18/21 0621 05/18/21 1106  GLUCAP 209* 195* 138* 82 139*   D-Dimer No results for input(s): DDIMER in the last 72 hours. Hgb A1c No results  for input(s): HGBA1C in the last 72 hours. Lipid Profile No results for input(s): CHOL, HDL, LDLCALC, TRIG, CHOLHDL, LDLDIRECT in the last 72 hours. Thyroid function studies No results for input(s): TSH, T4TOTAL, T3FREE, THYROIDAB in the last 72 hours.  Invalid input(s): FREET3 Anemia work up No results for input(s): VITAMINB12, FOLATE, FERRITIN, TIBC, IRON, RETICCTPCT in the last 72 hours. Microbiology Recent Results (from the past 240 hour(s))  Blood culture (routine x 2)     Status: None (Preliminary result)   Collection Time: 05/13/21 10:00 AM   Specimen: BLOOD  Result Value Ref Range Status   Specimen Description BLOOD BLOOD RIGHT HAND  Final   Special Requests   Final    BOTTLES DRAWN AEROBIC ONLY Blood Culture results may not be optimal due to an inadequate volume of blood received in culture bottles   Culture   Final    NO GROWTH 4 DAYS Performed at [facility], [place] [address], [place], [place] [address]    Report Status PENDING  Incomplete  Resp Panel by RT-PCR (Flu A&B, Covid) Nasopharyngeal Swab     Status: None   Collection Time: 05/13/21   9:35 PM   Specimen: Nasopharyngeal Swab; Nasopharyngeal(NP) swabs in vial transport medium  Result Value Ref Range Status   SARS Coronavirus 2 by RT PCR NEGATIVE NEGATIVE Final    Comment: (NOTE) SARS-CoV-2 target nucleic acids are NOT DETECTED.  The SARS-CoV-2 RNA is generally detectable in upper respiratory specimens during the acute phase of infection. The lowest concentration of SARS-CoV-2 viral copies this assay can detect is 138 copies/mL. A negative result does not preclude SARS-Cov-2 infection and should not be used as the sole basis for treatment or other patient management decisions. A negative result may occur with  improper specimen collection/handling, submission of specimen other than nasopharyngeal swab, presence of viral mutation(s) within the areas targeted by this assay, and inadequate number of viral copies(<138 copies/mL). A negative result must be combined with clinical observations, patient history, and epidemiological information. The expected result is Negative.  Fact Sheet for Patients:  EntrepreneurPulse.com.au  Fact Sheet for Healthcare Providers:  IncredibleEmployment.be  This test is no t yet approved or cleared by the [place] FDA and  has been authorized for detection and/or diagnosis of SARS-CoV-2 by FDA under an Emergency Use Authorization (EUA). This EUA will remain  in effect (meaning this test can be used) for the duration of the COVID-19 declaration under Section 564(b)(1) of the Act, 21 U.S.C.section 360bbb-3(b)(1), unless the authorization is terminated  or revoked sooner.       Influenza A by PCR NEGATIVE NEGATIVE Final   Influenza B by PCR NEGATIVE NEGATIVE Final    Comment: (NOTE) The Xpert Xpress SARS-CoV-2/FLU/RSV plus assay is intended as an aid in the diagnosis of influenza from Nasopharyngeal swab specimens and should not be used as a sole basis for treatment. Nasal washings and aspirates  are unacceptable for Xpert Xpress SARS-CoV-2/FLU/RSV testing.  Fact Sheet for Patients: EntrepreneurPulse.com.au  Fact Sheet for Healthcare Providers: IncredibleEmployment.be  This test is not yet approved or cleared by the [place] FDA and has been authorized for detection and/or diagnosis of SARS-CoV-2 by FDA under an Emergency Use Authorization (EUA). This EUA will remain in effect (meaning this test can be used) for the duration of the COVID-19 declaration under Section 564(b)(1) of the Act, 21 U.S.C. section 360bbb-3(b)(1), unless the authorization is terminated or revoked.  Performed at [facility], [place]  [address] [place], [place] [address]   Blood culture (routine x 2)     Status: None   Collection Time: 05/13/21  9:35 PM   Specimen: BLOOD  Result Value Ref Range Status   Specimen Description BLOOD LEFT ANTECUBITAL  Final   Special Requests   Final    BOTTLES DRAWN AEROBIC AND ANAEROBIC Blood Culture results may not be optimal due to an excessive volume of blood received in culture bottles   Culture   Final    NO GROWTH 5 DAYS Performed at [facility], [place] [address], [place], [place] [address]    Report Status 05/18/2021 FINAL  Final     Discharge Instructions:   Discharge Instructions     Diet - low sodium heart healthy   Complete by: As directed    Diet Carb Modified   Complete by: As directed    Discharge wound care:   Complete by: As directed    Keep wound clean and dry   Face-to-face encounter (required for Medicare/Medicaid patients)   Complete by: As directed    I Leah Thornberry certify that this patient is under my care and that I, or a nurse practitioner or physician's assistant working with me, had a face-to-face encounter that meets the physician face-to-face encounter requirements with this patient on 05/18/2021. The encounter with the patient was in whole, or in part for the following medical  condition(s) which is the primary reason for home health care (List medical condition): Unsteady gait, CHF   The encounter with the patient was in whole, or in part, for the following medical condition, which is the primary reason for home health care: Unsteady gait, CHF   I certify that, based on my findings, the following services are medically necessary home health services: Physical therapy   Reason for Medically Necessary Home Health Services: Therapy- Personnel officer, Public librarian   My clinical findings support the need for the above services: Unsafe ambulation due to balance issues   Further, I certify that my clinical findings support that this patient is homebound due to: Unsafe ambulation due to balance issues   For home use only DME 3 n 1   Complete by: As directed    Bariatric   Home Health   Complete by: As directed    To provide the following care/treatments: PT   Increase activity slowly   Complete by: As directed       Allergies as of 05/18/2021       Reactions   Penicillins Hives, Itching, Rash   Ace Inhibitors Cough   Cefadroxil Rash        Medication List     STOP taking these medications    clopidogrel 75 MG tablet Commonly known as: PLAVIX       TAKE these medications    acetaminophen 500 MG tablet Commonly known as: TYLENOL Take 500 mg by mouth daily as needed for pain.   Advair Diskus 250-50 MCG/ACT Aepb Generic drug: fluticasone-salmeterol Inhale 1 puff into the lungs 2 (two) times daily.   albuterol (2.5 MG/3ML) 0.083% nebulizer solution Commonly known as: PROVENTIL Take 2.5 mg by nebulization every 4 (four) hours as needed for shortness of breath or wheezing.   albuterol 108 (90 Base) MCG/ACT inhaler Commonly known as: VENTOLIN HFA Inhale 2 puffs into the lungs every 6 (six) hours as needed for shortness of breath.   amLODipine 5 MG tablet Commonly known as: NORVASC Take 5 mg by mouth  daily.   ammonium lactate  12 % cream Commonly known as: AMLACTIN Apply 1 application topically 2 (two) times daily.   atorvastatin 80 MG tablet Commonly known as: LIPITOR Take 80 mg by mouth daily.   bumetanide 2 MG tablet Commonly known as: BUMEX Take 2 mg by mouth 2 (two) times daily.   carvedilol 12.5 MG tablet Commonly known as: COREG Take 12.5 mg by mouth 2 (two) times daily.   cefdinir 300 MG capsule Commonly known as: OMNICEF Take 1 capsule (300 mg total) by mouth 2 (two) times daily for 3 days.   cetirizine 10 MG tablet Commonly known as: ZYRTEC Take 10 mg by mouth daily.   Cholecalciferol 50 MCG (2000 UT) Caps Take 1 capsule by mouth daily.   doxycycline 100 MG capsule Commonly known as: VIBRAMYCIN Take 1 capsule (100 mg total) by mouth 2 (two) times daily for 3 days.   Eliquis 5 MG Tabs tablet Generic drug: apixaban Take 5 mg by mouth 2 (two) times daily.   ferrous sulfate 325 (65 FE) MG tablet Take 1 tablet by mouth daily with breakfast.   fluticasone 50 MCG/ACT nasal spray Commonly known as: FLONASE Place 1 spray into both nostrils daily as needed for allergies.   gabapentin 100 MG capsule Commonly known as: NEURONTIN Take 100 mg by mouth 3 (three) times daily.   Jardiance 10 MG Tabs tablet Generic drug: empagliflozin Take 10 mg by mouth daily.   Levemir FlexTouch 100 UNIT/ML FlexPen Generic drug: insulin detemir Inject 22 Units into the skin 2 (two) times daily.   losartan 25 MG tablet Commonly known as: COZAAR Take 25 mg by mouth daily.   Multi-Vitamin tablet Take 1 tablet by mouth daily.   NovoLOG FlexPen 100 UNIT/ML FlexPen Generic drug: insulin aspart Inject 10-20 Units into the skin 3 (three) times daily.   prednisoLONE acetate 1 % ophthalmic suspension Commonly known as: PRED FORTE Place 1 drop into the left eye 4 (four) times daily.   Victoza 18 MG/3ML Sopn Generic drug: liraglutide Inject 1.8 mg into the skin daily.               Durable  Medical Equipment  (From admission, onward)           Start     Ordered   05/18/21 0000  For home use only DME 3 n 1       Comments: Bariatric   05/18/21 1548              Discharge Care Instructions  (From admission, onward)           Start     Ordered   05/18/21 0000  Discharge wound care:       Comments: Keep wound clean and dry   05/18/21 1134            Follow-up Information     Mosu, Darlin Coco, NP Follow up.   Specialty: Family Medicine Why: Please follow up in a week Contact information: [place] [address] [phone]                  Time coordinating discharge: 32 minutes  Signed:  [facility] 05/18/2021, 3:48 PM   Pager on [email or web address]. If 7PM-7AM, please contact night-coverage at [email or web address]

## 2021-05-19 LAB — LEGIONELLA PNEUMOPHILA SEROGP 1 UR AG: L. pneumophila Serogp 1 Ur Ag: NEGATIVE

## 2021-05-19 LAB — CULTURE, BLOOD (ROUTINE X 2): Culture: NO GROWTH

## 2022-11-23 NOTE — Progress Notes (Signed)
[facility] [place], [place] [address]  Pulmonary Sleep Medicine   Office Visit Note  Patient Name: Carla Reynolds DOB: 11/20/1965 MRN [id number]    Chief Complaint: Obstructive Sleep Apnea visit  Brief History:  Carla Reynolds is seen today for an initial consult for APAP@ 8-14 cmH2O. The patient has a 4 year history of sleep apnea. Patient is using PAP nightly.  The patient feels somewhat rested after sleeping with PAP.  The patient reports benefiting from PAP use. Reported sleepiness is improved and the Epworth Sleepiness Score is 16 out of 24. The patient occasionally take naps. The patient complains of the following: pt is in need of a new supplies due excessive wear and tear.  The compliance download shows 84% compliance with an average use time of 5 hours 46 minutes. The AHI is 1.7.  The patient does not complain of limb movements disrupting sleep, but will have some restlessness of legs in afternoon. The patient continues to require PAP therapy in order to eliminate sleep apnea. The patient states she has severe IDA and is seeing hematology and will be starting infusions.  She does get very SOB with exertion.  ROS  General: (-) fever, (-) chills, (-) night sweat Nose and Sinuses: (-) nasal stuffiness or itchiness, (-) postnasal drip, (-) nosebleeds, (-) sinus trouble. Mouth and Throat: (-) sore throat, (-) hoarseness. Neck: (-) swollen glands, (-) enlarged thyroid, (-) neck pain. Respiratory: - cough, + shortness of breath, + wheezing. Neurologic: - numbness, - tingling. Psychiatric: - anxiety, + depression   Current Medication: Outpatient Encounter Medications as of 11/24/2022  Medication Sig Note   acetaminophen (TYLENOL) 500 MG tablet Take 500 mg by mouth daily as needed for pain.    ADVAIR DISKUS 250-50 MCG/ACT AEPB Inhale 1 puff into the lungs 2 (two) times daily.    albuterol (PROVENTIL) (2.5 MG/3ML) 0.083% nebulizer solution Take 2.5 mg by  nebulization every 4 (four) hours as needed for shortness of breath or wheezing.    albuterol (VENTOLIN HFA) 108 (90 Base) MCG/ACT inhaler Inhale 2 puffs into the lungs every 6 (six) hours as needed for shortness of breath.    amLODipine (NORVASC) 5 MG tablet Take 5 mg by mouth daily.    ammonium lactate (AMLACTIN) 12 % cream Apply 1 application topically 2 (two) times daily.    atorvastatin (LIPITOR) 80 MG tablet Take 80 mg by mouth daily.    bumetanide (BUMEX) 2 MG tablet Take 2 mg by mouth 2 (two) times daily.    carvedilol (COREG) 12.5 MG tablet Take 12.5 mg by mouth 2 (two) times daily.    cetirizine (ZYRTEC) 10 MG tablet Take 10 mg by mouth daily.    Cholecalciferol 50 MCG (2000 UT) CAPS Take 1 capsule by mouth daily.    ELIQUIS 5 MG TABS tablet Take 5 mg by mouth 2 (two) times daily.    ferrous sulfate 325 (65 FE) MG tablet Take 1 tablet by mouth daily with breakfast.    fluticasone (FLONASE) 50 MCG/ACT nasal spray Place 1 spray into both nostrils daily as needed for allergies.    gabapentin (NEURONTIN) 100 MG capsule Take 100 mg by mouth 3 (three) times daily.    JARDIANCE 10 MG TABS tablet Take 10 mg by mouth daily.    LEVEMIR FLEXTOUCH 100 UNIT/ML FlexTouch Pen Inject 22 Units into the skin 2 (two) times daily.    losartan (COZAAR) 25 MG tablet Take 25 mg by mouth daily.  Multiple Vitamin (MULTI-VITAMIN) tablet Take 1 tablet by mouth daily.    NOVOLOG FLEXPEN 100 UNIT/ML FlexPen Inject 10-20 Units into the skin 3 (three) times daily. 05/14/2021: Pt states that units depend on what she eats   prednisoLONE acetate (PRED FORTE) 1 % ophthalmic suspension Place 1 drop into the left eye 4 (four) times daily.    VICTOZA 18 MG/3ML SOPN Inject 1.8 mg into the skin daily.    No facility-administered encounter medications on file as of 11/24/2022.    Surgical History: History reviewed. No pertinent surgical history.  Medical History: Past Medical History:  Diagnosis Date   CKD (chronic  kidney disease), stage IV ([place]) 05/14/2021   Coronary artery disease involving native coronary artery of native heart without angina pectoris 05/14/2021   Essential hypertension 05/14/2021   History of aortic stenosis 05/14/2021   Lymphedema 05/14/2021   Mixed diabetic hyperlipidemia associated with type 2 diabetes mellitus ([place]) 05/14/2021   Moderate persistent asthma without complication [id number]   S/P TAVR (transcatheter aortic valve replacement) 05/14/2021   Type 2 diabetes mellitus with stage 4 chronic kidney disease, with long-term current use of insulin ([place]) 05/14/2021    Family History: Non contributory to the present illness  Social History: Social History   Socioeconomic History   Marital status: Divorced    Spouse name: Not on file   Number of children: Not on file   Years of education: Not on file   Highest education level: Not on file  Occupational History   Not on file  Tobacco Use   Smoking status: Former    Types: Cigarettes   Smokeless tobacco: Never  Substance and Sexual Activity   Alcohol use: Never   Drug use: Never   Sexual activity: Not on file  Other Topics Concern   Not on file  Social History Narrative   Not on file   Social Determinants of Health   Financial Resource Strain: Not on file  Food Insecurity: Not on file  Transportation Needs: Not on file  Physical Activity: Not on file  Stress: Not on file  Social Connections: Not on file  Intimate Partner Violence: Not on file    Vital Signs: Blood pressure (!) 150/61, pulse 81, resp. rate 18, height '5\' 8"'$  (1.727 m), weight (!) 390 lb (176.9 kg), SpO2 99 %. Body mass index is 59.3 kg/m.    Examination: General Appearance: The patient is well-developed, well-nourished, and in no distress. Neck Circumference: 45 cm Skin: Gross inspection of skin unremarkable. Head: normocephalic, no gross deformities. Eyes: no gross deformities noted. ENT: ears appear grossly normal Neurologic: Alert and  oriented. No involuntary movements.  STOP BANG RISK ASSESSMENT S (snore) Have you been told that you snore?     NO   T (tired) Are you often tired, fatigued, or sleepy during the day?   YES  O (obstruction) Do you stop breathing, choke, or gasp during sleep? NO   P (pressure) Do you have or are you being treated for high blood pressure? YES   B (BMI) Is your body index greater than 35 kg/m? YES   A (age) Are you 37 years old or older? YES   N (neck) Do you have a neck circumference greater than 16 inches?   YES   G (gender) Are you a female? NO   TOTAL STOP/BANG "YES" ANSWERS 5       A STOP-Bang score of 2 or less is considered low risk, and a score of 5 or  more is high risk for having either moderate or severe OSA. For people who score 3 or 4, doctors may need to perform further assessment to determine how likely they are to have OSA.         EPWORTH SLEEPINESS SCALE:  Scale:  (0)= no chance of dozing; (1)= slight chance of dozing; (2)= moderate chance of dozing; (3)= high chance of dozing  Chance  Situtation    Sitting and reading: 1    Watching TV: 1    Sitting Inactive in public: 2    As a passenger in car: 2      Lying down to rest: 2    Sitting and talking: 3    Sitting quielty after lunch: 2    In a car, stopped in traffic: 3   TOTAL SCORE:   16 out of 24    SLEEP STUDIES:  Split Study (09/2018 at [facility]) AHI 22/hr, REM AHI 65/hr, non-REM AHI 20/hr, Supine AHI 33/hr, min SpO2 78%, CPAP@ 10 cmH2O or APAP@ 7-15 cmH2O    CPAP COMPLIANCE DATA:  Date Range: 11/23/2021-11/22/2022  Average Daily Use: 5 hours 46 minutes  Median Use: 5 hours 43 minutes  Compliance for > 4 Hours: 84%  AHI: 1.7 respiratory events per hour  Days Used: 353/365 days  Mask Leak: 46.2  95th Percentile Pressure: 11.5         LABS: No results found for this or any previous visit (from the past 2160 hour(s)).  Radiology: VAS [place] LOWER EXTREMITY VENOUS (DVT) (ONLY  [facility] & [facility])  Result Date: 05/14/2021  Lower Venous DVT Study Patient Name:  Carla Reynolds  Date of Exam:   05/14/2021 Medical Rec #: [id number]        Accession #:    [id number] Date of Birth: 04-Jan-1966        Patient Gender: F Patient Age:   24 years Exam Location:  [facility] Procedure:      VAS [place] LOWER EXTREMITY VENOUS (DVT) Referring Phys: Inda Merlin --------------------------------------------------------------------------------  Indications: Pain.  Limitations: Body habitus and poor ultrasound/tissue interface. Comparison Study: no prior Performing Technologist: Archie Patten RVS  Examination Guidelines: A complete evaluation includes B-mode imaging, spectral Doppler, color Doppler, and power Doppler as needed of all accessible portions of each vessel. Bilateral testing is considered an integral part of a complete examination. Limited examinations for reoccurring indications may be performed as noted. The reflux portion of the exam is performed with the patient in reverse Trendelenburg.  +---------+---------------+---------+-----------+----------+-------------------+ RIGHT    CompressibilityPhasicitySpontaneityPropertiesThrombus Aging      +---------+---------------+---------+-----------+----------+-------------------+ CFV      Full           Yes      Yes                                      +---------+---------------+---------+-----------+----------+-------------------+ SFJ      Full                                                             +---------+---------------+---------+-----------+----------+-------------------+ FV Prox  Full                                                             +---------+---------------+---------+-----------+----------+-------------------+  FV Mid                  Yes      Yes                                      +---------+---------------+---------+-----------+----------+-------------------+ FV Distal                                              Not well visualized +---------+---------------+---------+-----------+----------+-------------------+ PFV      Full                                                             +---------+---------------+---------+-----------+----------+-------------------+ POP      Full           Yes      Yes                                      +---------+---------------+---------+-----------+----------+-------------------+ PTV      Full                                                             +---------+---------------+---------+-----------+----------+-------------------+ PERO                                                  Not well visualized +---------+---------------+---------+-----------+----------+-------------------+   +----+---------------+---------+-----------+----------+--------------+ LEFTCompressibilityPhasicitySpontaneityPropertiesThrombus Aging +----+---------------+---------+-----------+----------+--------------+ CFV Full           Yes      Yes                                 +----+---------------+---------+-----------+----------+--------------+    Summary: RIGHT: - There is no evidence of deep vein thrombosis in the lower extremity. However, portions of this examination were limited- see technologist comments above.  - No cystic structure found in the popliteal fossa.  LEFT: - No evidence of common femoral vein obstruction.  *See table(s) above for measurements and observations. Electronically signed by Harold Barban MD on 05/14/2021 at 10:42:32 PM.    Final    DG Chest Portable 1 View  Result Date: 05/13/2021 CLINICAL DATA:  Productive cough and chills, initial encounter EXAM: PORTABLE CHEST 1 VIEW COMPARISON:  None. FINDINGS: Cardiac shadow is within normal limits. Pacing device is seen in satisfactory position. Lungs are well aerated with mild right basilar atelectasis and small effusion. No other focal abnormality is seen. IMPRESSION:  Mild right basilar atelectasis and effusion. Electronically Signed   By: Inez Catalina M.D.   On: 05/13/2021 21:51    No results found.  No results found.    Assessment and Plan: Patient Active Problem List  Diagnosis Date Noted   CKD (chronic kidney disease), stage IV ([place]) 05/14/2021   Type 2 diabetes mellitus with stage 4 chronic kidney disease, with long-term current use of insulin ([place]) 05/14/2021   Mixed diabetic hyperlipidemia associated with type 2 diabetes mellitus ([place]) 05/14/2021   Moderate persistent asthma without complication 0000000   Lymphedema 05/14/2021   Coronary artery disease involving native coronary artery of native heart without angina pectoris 05/14/2021   History of aortic stenosis 05/14/2021   S/P TAVR (transcatheter aortic valve replacement) 05/14/2021   Essential hypertension 05/14/2021   Lactic acidosis 05/14/2021   Elevated troponin 05/14/2021   Sepsis ([place]) 05/14/2021   Pneumonia of right lung due to infectious organism 05/13/2021      The patient does tolerate PAP and reports benefit from PAP use. The patient was reminded how to adjust mask fit and advised to change supplies regularly. The patient was also counselled on nightly use. The compliance is good. The AHI is 1.7. Pt continues to require cpap to treat her apnea and is medically necessary.   1. OSA (obstructive sleep apnea) Continue nightly use  2. CPAP use counseling CPAP couseling-Discussed importance of adequate CPAP use as well as proper care and cleaning techniques of machine and all supplies.  3. Essential hypertension Continue current medication and f/u with PCP.  4. Moderate persistent asthma without complication Continue inhalers as prescribed  5. S/P TAVR (transcatheter aortic valve replacement) Followed by cardiology  6. Coronary artery disease involving native coronary artery of native heart without angina pectoris Followed by cardiology  7. Iron deficiency  anemia, unspecified iron deficiency anemia type Followed by hematology and is contributing to SOB with exertion. Advised to follow up with provider or go to ED if acute worsening.  8. Morbid obesity with BMI of 50.0-59.9, adult ([place]) Obesity Counseling: Had a lengthy discussion regarding patients BMI and weight issues. Patient was instructed on portion control as well as increased activity. Also discussed caloric restrictions with trying to maintain intake less than 2000 Kcal. Discussions were made in accordance with the 5As of weight management. Simple actions such as not eating late and if able to, taking a walk is suggested.    General Counseling: I have discussed the findings of the evaluation and examination with [facility]  I have also discussed any further diagnostic evaluation thatmay be needed or ordered today. Calliope verbalizes understanding of the findings of todays visit. We also reviewed her medications today and discussed drug interactions and side effects including but not limited excessive drowsiness and altered mental states. We also discussed that there is always a risk not just to her but also people around her. she has been encouraged to call the office with any questions or concerns that should arise related to todays visit.  No orders of the defined types were placed in this encounter.       I have personally obtained a history, examined the patient, evaluated laboratory and imaging results, formulated the assessment and plan and placed orders.  This patient was seen by Carla Dallas, PA-C in collaboration with Dr. Devona Konig as a part of collaborative care agreement.   Carla Gee, MD [facility] Diplomate ABMS Pulmonary Critical Care Medicine and Sleep Medicine

## 2022-11-24 ENCOUNTER — Ambulatory Visit (INDEPENDENT_AMBULATORY_CARE_PROVIDER_SITE_OTHER): Payer: Medicare HMO | Admitting: Internal Medicine

## 2022-11-24 VITALS — BP 150/61 | HR 81 | Resp 18 | Ht 68.0 in | Wt 390.0 lb

## 2022-11-24 DIAGNOSIS — I10 Essential (primary) hypertension: Secondary | ICD-10-CM | POA: Diagnosis not present

## 2022-11-24 DIAGNOSIS — D509 Iron deficiency anemia, unspecified: Secondary | ICD-10-CM

## 2022-11-24 DIAGNOSIS — Z7189 Other specified counseling: Secondary | ICD-10-CM | POA: Diagnosis not present

## 2022-11-24 DIAGNOSIS — Z6841 Body Mass Index (BMI) 40.0 and over, adult: Secondary | ICD-10-CM

## 2022-11-24 DIAGNOSIS — G4733 Obstructive sleep apnea (adult) (pediatric): Secondary | ICD-10-CM | POA: Diagnosis not present

## 2022-11-24 DIAGNOSIS — J4540 Moderate persistent asthma, uncomplicated: Secondary | ICD-10-CM

## 2022-11-24 DIAGNOSIS — Z952 Presence of prosthetic heart valve: Secondary | ICD-10-CM

## 2022-11-24 DIAGNOSIS — I2510 Atherosclerotic heart disease of native coronary artery without angina pectoris: Secondary | ICD-10-CM

## 2022-11-24 NOTE — Patient Instructions (Signed)

## 2023-04-14 IMAGING — DX DG CHEST 1V PORT
1 series · 1 of 1 positions shown · non-contrast
Comparison: None.

CLINICAL DATA: Productive cough and chills, initial encounter

EXAM:
PORTABLE CHEST 1 VIEW

[chest ap]
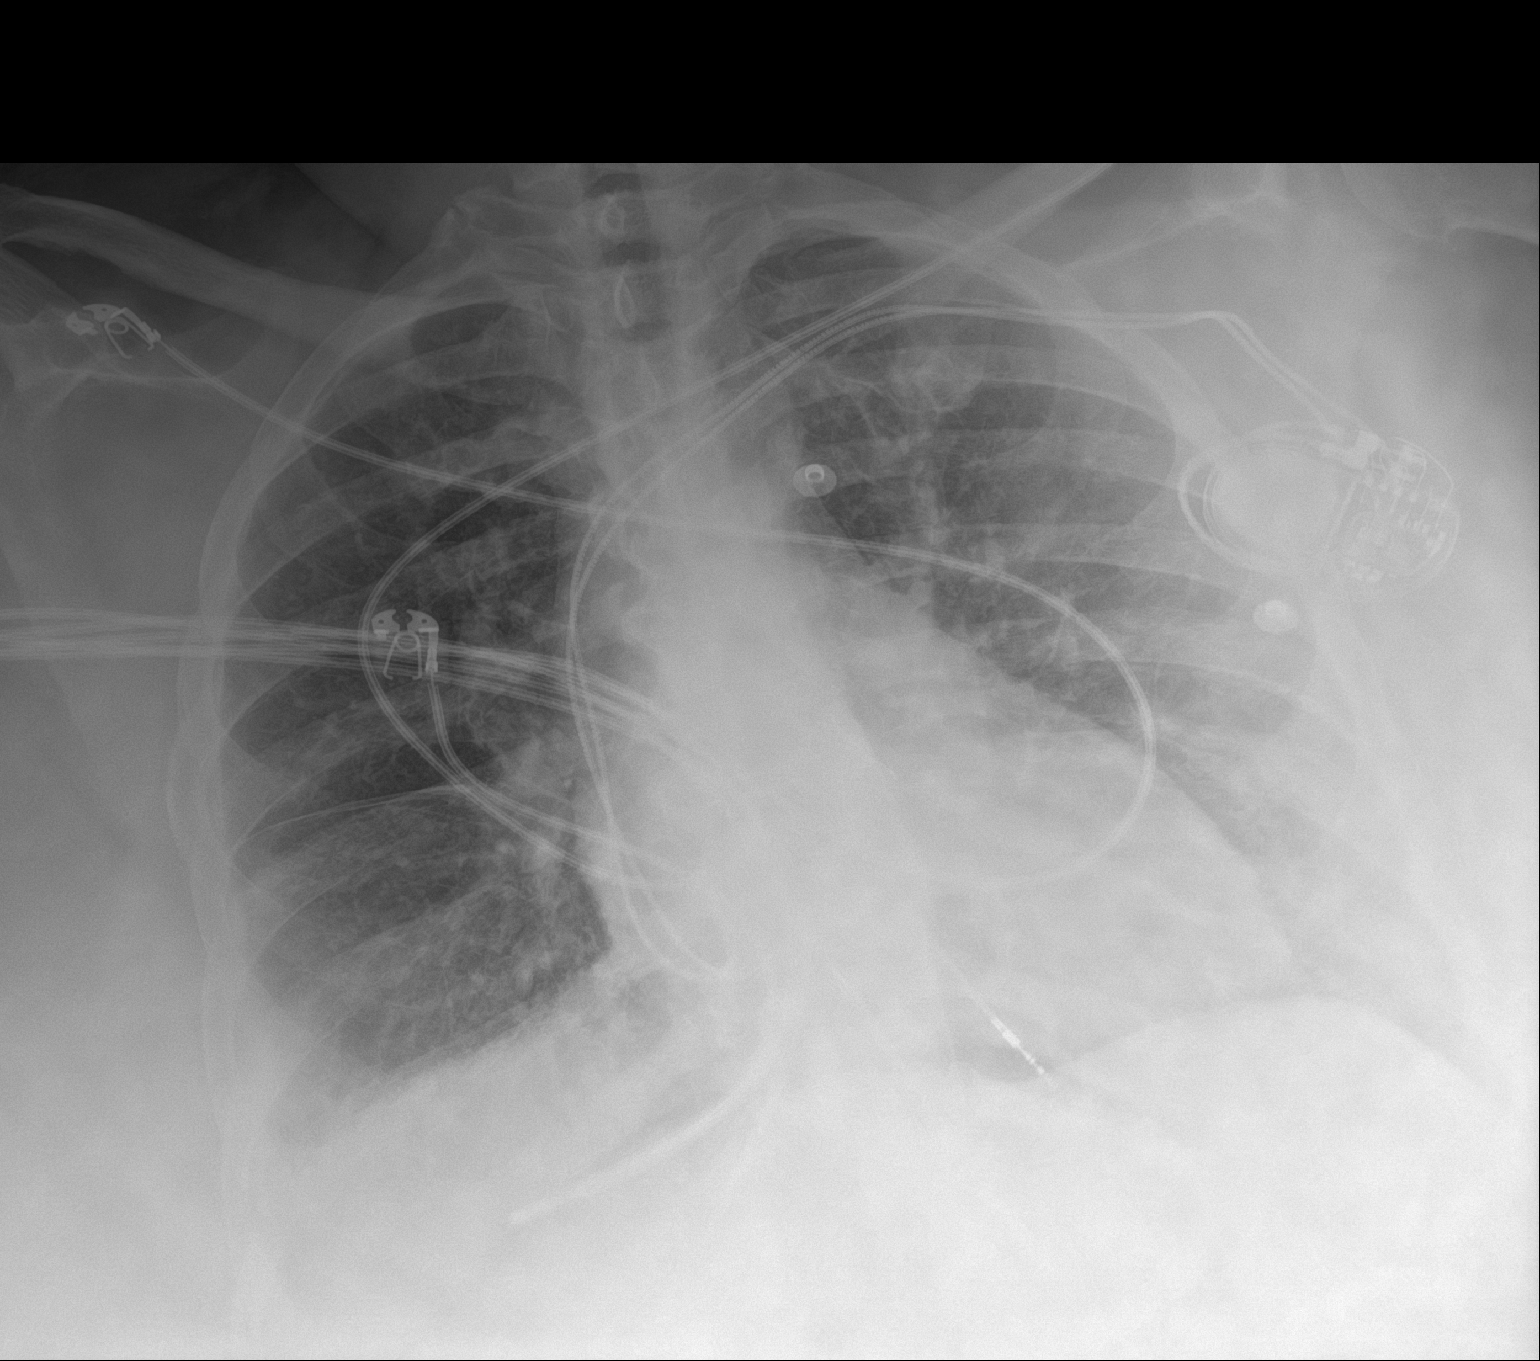

[1 of 1 positions shown; findings below may reference images not displayed]

FINDINGS: Cardiac shadow is within normal limits. Pacing device is seen in
satisfactory position. Lungs are well aerated with mild right
basilar atelectasis and small effusion. No other focal abnormality
is seen.
IMPRESSION: Mild right basilar atelectasis and effusion.
